# Patient Record
Sex: Male | Born: 1945 | Race: White | Hispanic: No | Marital: Single | Smoking: Former smoker
Health system: Southern US, Community
[De-identification: ages and names within clinical notes are randomized; demographics above are authoritative.]

## PROBLEM LIST (undated history)

## (undated) DIAGNOSIS — R011 Cardiac murmur, unspecified: Secondary | ICD-10-CM

## (undated) DIAGNOSIS — I10 Essential (primary) hypertension: Secondary | ICD-10-CM

## (undated) DIAGNOSIS — K219 Gastro-esophageal reflux disease without esophagitis: Secondary | ICD-10-CM

## (undated) HISTORY — PX: PACEMAKER INSERTION: SHX728

## (undated) HISTORY — PX: TONSILLECTOMY: SUR1361

## (undated) HISTORY — PX: CORONARY ARTERY BYPASS GRAFT: SHX141

## (undated) HISTORY — PX: APPENDECTOMY: SHX54

## (undated) HISTORY — DX: Gastro-esophageal reflux disease without esophagitis: K21.9

## (undated) HISTORY — DX: Cardiac murmur, unspecified: R01.1

## (undated) HISTORY — PX: HERNIA REPAIR: SHX51

---

## 2002-11-01 ENCOUNTER — Ambulatory Visit (HOSPITAL_COMMUNITY): Admission: RE | Admit: 2002-11-01 | Discharge: 2002-11-01 | Payer: Self-pay | Admitting: General Surgery

## 2004-07-24 ENCOUNTER — Ambulatory Visit: Payer: Self-pay | Admitting: Internal Medicine

## 2005-10-16 ENCOUNTER — Ambulatory Visit: Payer: Self-pay | Admitting: *Deleted

## 2015-03-18 ENCOUNTER — Emergency Department (HOSPITAL_COMMUNITY): Payer: Medicare Other

## 2015-03-18 ENCOUNTER — Encounter (HOSPITAL_COMMUNITY): Payer: Self-pay | Admitting: Emergency Medicine

## 2015-03-18 ENCOUNTER — Inpatient Hospital Stay (HOSPITAL_COMMUNITY)
Admission: EM | Admit: 2015-03-18 | Discharge: 2015-03-20 | DRG: 191 | Disposition: A | Discharge status: Home / Self Care | Payer: Medicare Other | Attending: Internal Medicine | Admitting: Internal Medicine

## 2015-03-18 DIAGNOSIS — Z9049 Acquired absence of other specified parts of digestive tract: Secondary | ICD-10-CM | POA: Diagnosis not present

## 2015-03-18 DIAGNOSIS — R000 Tachycardia, unspecified: Secondary | ICD-10-CM | POA: Diagnosis not present

## 2015-03-18 DIAGNOSIS — F17210 Nicotine dependence, cigarettes, uncomplicated: Secondary | ICD-10-CM | POA: Diagnosis not present

## 2015-03-18 DIAGNOSIS — E871 Hypo-osmolality and hyponatremia: Secondary | ICD-10-CM | POA: Diagnosis not present

## 2015-03-18 DIAGNOSIS — J441 Chronic obstructive pulmonary disease with (acute) exacerbation: Secondary | ICD-10-CM | POA: Diagnosis not present

## 2015-03-18 DIAGNOSIS — E669 Obesity, unspecified: Secondary | ICD-10-CM | POA: Diagnosis not present

## 2015-03-18 DIAGNOSIS — I10 Essential (primary) hypertension: Secondary | ICD-10-CM | POA: Diagnosis not present

## 2015-03-18 DIAGNOSIS — Z6832 Body mass index (BMI) 32.0-32.9, adult: Secondary | ICD-10-CM | POA: Diagnosis not present

## 2015-03-18 DIAGNOSIS — R0902 Hypoxemia: Secondary | ICD-10-CM | POA: Diagnosis not present

## 2015-03-18 DIAGNOSIS — D72829 Elevated white blood cell count, unspecified: Secondary | ICD-10-CM | POA: Diagnosis not present

## 2015-03-18 DIAGNOSIS — J80 Acute respiratory distress syndrome: Secondary | ICD-10-CM | POA: Diagnosis present

## 2015-03-18 DIAGNOSIS — R0600 Dyspnea, unspecified: Secondary | ICD-10-CM | POA: Diagnosis not present

## 2015-03-18 DIAGNOSIS — J440 Chronic obstructive pulmonary disease with acute lower respiratory infection: Secondary | ICD-10-CM | POA: Diagnosis not present

## 2015-03-18 DIAGNOSIS — R739 Hyperglycemia, unspecified: Secondary | ICD-10-CM | POA: Diagnosis present

## 2015-03-18 DIAGNOSIS — T380X5A Adverse effect of glucocorticoids and synthetic analogues, initial encounter: Secondary | ICD-10-CM | POA: Diagnosis not present

## 2015-03-18 DIAGNOSIS — Z87898 Personal history of other specified conditions: Secondary | ICD-10-CM | POA: Diagnosis present

## 2015-03-18 HISTORY — DX: Essential (primary) hypertension: I10

## 2015-03-18 LAB — BASIC METABOLIC PANEL
ANION GAP: 10 (ref 5–15)
BUN: 16 mg/dL (ref 6–20)
CALCIUM: 8.9 mg/dL (ref 8.9–10.3)
CHLORIDE: 96 mmol/L — AB (ref 101–111)
CO2: 24 mmol/L (ref 22–32)
CREATININE: 0.85 mg/dL (ref 0.61–1.24)
GFR calc Af Amer: >60 mL/min (ref >60)
GFR calc non Af Amer: >60 mL/min (ref >60)
GLUCOSE: 145 mg/dL — AB (ref 65–99)
POTASSIUM: 4.2 mmol/L (ref 3.5–5.1)
SODIUM: 130 mmol/L — AB (ref 135–145)

## 2015-03-18 LAB — COMPREHENSIVE METABOLIC PANEL
ALK PHOS: 57 U/L (ref 38–126)
ALT: 23 U/L (ref 17–63)
AST: 30 U/L (ref 15–41)
Albumin: 4 g/dL (ref 3.5–5.0)
Anion gap: 9 (ref 5–15)
BUN: 20 mg/dL (ref 6–20)
CO2: 27 mmol/L (ref 22–32)
Calcium: 9.2 mg/dL (ref 8.9–10.3)
Chloride: 94 mmol/L — ABNORMAL LOW (ref 101–111)
Creatinine, Ser: 1.03 mg/dL (ref 0.61–1.24)
Glucose, Bld: 171 mg/dL — ABNORMAL HIGH (ref 65–99)
POTASSIUM: 4.1 mmol/L (ref 3.5–5.1)
SODIUM: 130 mmol/L — AB (ref 135–145)
Total Bilirubin: 0.5 mg/dL (ref 0.3–1.2)
Total Protein: 7.3 g/dL (ref 6.5–8.1)

## 2015-03-18 LAB — CBC WITH DIFFERENTIAL/PLATELET
BASOS ABS: 0 10*3/uL (ref 0.0–0.1)
BASOS PCT: 0 % (ref 0–1)
EOS ABS: 0 10*3/uL (ref 0.0–0.7)
EOS PCT: 0 % (ref 0–5)
HEMATOCRIT: 41.5 % (ref 39.0–52.0)
HEMOGLOBIN: 14.3 g/dL (ref 13.0–17.0)
Lymphocytes Relative: 3 % — ABNORMAL LOW (ref 12–46)
Lymphs Abs: 0.4 10*3/uL — ABNORMAL LOW (ref 0.7–4.0)
MCH: 30 pg (ref 26.0–34.0)
MCHC: 34.5 g/dL (ref 30.0–36.0)
MCV: 87.2 fL (ref 78.0–100.0)
MONO ABS: 0.4 10*3/uL (ref 0.1–1.0)
MONOS PCT: 3 % (ref 3–12)
Neutro Abs: 16 10*3/uL — ABNORMAL HIGH (ref 1.7–7.7)
Neutrophils Relative %: 94 % — ABNORMAL HIGH (ref 43–77)
Platelets: 281 10*3/uL (ref 150–400)
RBC: 4.76 MIL/uL (ref 4.22–5.81)
RDW: 13.5 % (ref 11.5–15.5)
WBC: 16.9 10*3/uL — ABNORMAL HIGH (ref 4.0–10.5)

## 2015-03-18 LAB — EXPECTORATED SPUTUM ASSESSMENT W REFEX TO RESP CULTURE

## 2015-03-18 LAB — TROPONIN I: Troponin I: 0.03 ng/mL (ref <0.031)

## 2015-03-18 LAB — BRAIN NATRIURETIC PEPTIDE: B Natriuretic Peptide: 127.9 pg/mL — ABNORMAL HIGH (ref 0.0–100.0)

## 2015-03-18 LAB — EXPECTORATED SPUTUM ASSESSMENT W GRAM STAIN, RFLX TO RESP C

## 2015-03-18 MED ORDER — METHYLPREDNISOLONE SODIUM SUCC 125 MG IJ SOLR
80.0000 mg | Freq: Once | INTRAMUSCULAR | Status: DC
  Filled 2015-03-18: qty 1.28

## 2015-03-18 MED ORDER — ALBUTEROL (5 MG/ML) CONTINUOUS INHALATION SOLN
10.0000 mg/h | INHALATION_SOLUTION | Freq: Once | RESPIRATORY_TRACT | Status: AC
  Administered 2015-03-18: 10 mg/h via RESPIRATORY_TRACT
  Filled 2015-03-18: qty 20

## 2015-03-18 MED ORDER — IPRATROPIUM-ALBUTEROL 0.5-2.5 (3) MG/3ML IN SOLN: 3.0000 mL | RESPIRATORY_TRACT | Status: DC | PRN

## 2015-03-18 MED ORDER — DOXYCYCLINE HYCLATE 100 MG PO TABS
100.0000 mg | ORAL_TABLET | Freq: Two times a day (BID) | ORAL | Status: DC
  Filled 2015-03-18: qty 1

## 2015-03-18 MED ORDER — ENOXAPARIN SODIUM 40 MG/0.4ML ~~LOC~~ SOLN
40.0000 mg | SUBCUTANEOUS | Status: DC
  Administered 2015-03-18 – 2015-03-19 (×2): 40 mg via SUBCUTANEOUS
  Filled 2015-03-18 (×3): qty 0.4

## 2015-03-18 MED ORDER — IPRATROPIUM-ALBUTEROL 0.5-2.5 (3) MG/3ML IN SOLN: 3.0000 mL | RESPIRATORY_TRACT | Status: DC

## 2015-03-18 MED ORDER — IPRATROPIUM-ALBUTEROL 0.5-2.5 (3) MG/3ML IN SOLN
RESPIRATORY_TRACT | Status: AC
  Administered 2015-03-18: 3 mL via RESPIRATORY_TRACT
  Filled 2015-03-18: qty 3

## 2015-03-18 MED ORDER — CEFTRIAXONE SODIUM IN DEXTROSE 20 MG/ML IV SOLN
1.0000 g | INTRAVENOUS | Status: DC
  Administered 2015-03-18 – 2015-03-19 (×2): 1 g via INTRAVENOUS
  Filled 2015-03-18 (×3): qty 50

## 2015-03-18 MED ORDER — HYDROMORPHONE HCL 2 MG/ML IJ SOLN: 2.0000 mg | Freq: Once | INTRAMUSCULAR | Status: DC

## 2015-03-18 MED ORDER — ALUM & MAG HYDROXIDE-SIMETH 200-200-20 MG/5ML PO SUSP
30.0000 mL | Freq: Once | ORAL | Status: AC
  Administered 2015-03-18: 30 mL via ORAL
  Filled 2015-03-18: qty 30

## 2015-03-18 MED ORDER — PNEUMOCOCCAL VAC POLYVALENT 25 MCG/0.5ML IJ INJ
0.5000 mL | INJECTION | INTRAMUSCULAR | Status: AC
  Administered 2015-03-19: 0.5 mL via INTRAMUSCULAR
  Filled 2015-03-18 (×2): qty 0.5

## 2015-03-18 MED ORDER — DEXTROSE 5 % IV SOLN
500.0000 mg | INTRAVENOUS | Status: DC
  Administered 2015-03-18 – 2015-03-19 (×2): 500 mg via INTRAVENOUS
  Filled 2015-03-18 (×3): qty 500

## 2015-03-18 MED ORDER — OXYMETAZOLINE HCL 0.05 % NA SOLN: 1.0000 | Freq: Two times a day (BID) | NASAL | Status: DC | PRN

## 2015-03-18 MED ORDER — NICOTINE 21 MG/24HR TD PT24
21.0000 mg | MEDICATED_PATCH | Freq: Every day | TRANSDERMAL | Status: DC
  Administered 2015-03-18 – 2015-03-20 (×3): 21 mg via TRANSDERMAL
  Filled 2015-03-18 (×3): qty 1

## 2015-03-18 MED ORDER — IPRATROPIUM-ALBUTEROL 0.5-2.5 (3) MG/3ML IN SOLN
3.0000 mL | Freq: Four times a day (QID) | RESPIRATORY_TRACT | Status: DC
  Administered 2015-03-18 – 2015-03-19 (×5): 3 mL via RESPIRATORY_TRACT
  Filled 2015-03-18 (×4): qty 3

## 2015-03-18 MED ORDER — ZOLPIDEM TARTRATE 5 MG PO TABS
5.0000 mg | ORAL_TABLET | Freq: Every evening | ORAL | Status: DC | PRN
  Administered 2015-03-18: 5 mg via ORAL
  Filled 2015-03-18: qty 1

## 2015-03-18 MED ORDER — METHYLPREDNISOLONE SODIUM SUCC 125 MG IJ SOLR
80.0000 mg | Freq: Four times a day (QID) | INTRAMUSCULAR | Status: DC
  Administered 2015-03-18 – 2015-03-19 (×5): 80 mg via INTRAVENOUS
  Filled 2015-03-18 (×6): qty 1.28

## 2015-03-18 MED ORDER — HYDROCHLOROTHIAZIDE 12.5 MG PO CAPS
12.5000 mg | ORAL_CAPSULE | Freq: Every day | ORAL | Status: DC
  Administered 2015-03-18 – 2015-03-20 (×3): 12.5 mg via ORAL
  Filled 2015-03-18 (×3): qty 1

## 2015-03-18 MED ORDER — GUAIFENESIN ER 600 MG PO TB12
600.0000 mg | ORAL_TABLET | Freq: Two times a day (BID) | ORAL | Status: DC
  Administered 2015-03-18 – 2015-03-20 (×5): 600 mg via ORAL
  Filled 2015-03-18 (×7): qty 1

## 2015-03-18 MED ORDER — METHYLPREDNISOLONE SODIUM SUCC 125 MG IJ SOLR: 125.0000 mg | Freq: Once | INTRAMUSCULAR | Status: DC

## 2015-03-18 MED ORDER — GUAIFENESIN-DM 100-10 MG/5ML PO SYRP: 5.0000 mL | ORAL_SOLUTION | ORAL | Status: DC | PRN

## 2015-03-18 MED ORDER — SODIUM CHLORIDE 0.9 % IJ SOLN
3.0000 mL | Freq: Two times a day (BID) | INTRAMUSCULAR | Status: DC
  Administered 2015-03-18 – 2015-03-19 (×4): 3 mL via INTRAVENOUS

## 2015-03-18 MED ORDER — LISINOPRIL 10 MG PO TABS
10.0000 mg | ORAL_TABLET | Freq: Every day | ORAL | Status: DC
  Administered 2015-03-18 – 2015-03-20 (×3): 10 mg via ORAL
  Filled 2015-03-18 (×3): qty 1

## 2015-03-18 MED ORDER — LISINOPRIL-HYDROCHLOROTHIAZIDE 10-12.5 MG PO TABS: 1.0000 | ORAL_TABLET | Freq: Every day | ORAL | Status: DC

## 2015-03-18 NOTE — H&P (Signed)
[facility] History and Physical  Mady Haagensenaul R Edler ZOX:[id number] DOB: 01/18/1946 DOA: 03/18/2015  Referring physician: ED physician, Dr. Rhunette CroftNanavati  PCP: Pt has no PCP   Chief Complaint: dyspnea  HPI:  Pt is 69 yo male with known HTN, history of > 30 yrs of 1 ppd smoking, [facility] with main concern of several days duration of progressively worsening dyspnea that initially started with exertion and has progressed to dyspnea at rest in the past 24 hours, associated with mixed episodes of productive and non productive cough, wheezing, malaise. Pt has denies chest pain or abd concerns, no similar events in the past, continues to smoke.   In ED, pt noted to be wheezing and with oxygen saturation 91% on RA requiring placement on oxygen. Also started on BD and given solumedrol. TRH asked to admit for further evaluation.   Assessment and Plan: Active Problems:   Acute respiratory distress secondary to COPD with acute exacerbation and with hypoxia - admit to telemetry unit due to tachycardia - place on solumedrol, BD's scheduled and as needed - place on empiric ABX, follow up on sputum culture, strep pneumo and urine legionella - also provide antitussives as needed along with mucinex    Tobacco abuse - cessation discussed in detail and pt has verbalized understanding  - provide nicotine patch    Alcohol use - check CMET - may need to be placed on CIWA if signs of withdrawal noted, non currently    Acute hyponatremia - will allow regular diet and see if it stabilizes    Hyperglycemia - possibly steroid induced - check A1C   Leukocytosis - possibly from acute illness - started on empiric ABX and will repeat CBC in AM   HTN - continue home medical regimen   DVT prophylaxis - continue Lovenox    Obesity - Body mass index is 32.57 kg/(m^2).  Radiological Exams on Admission: Dg Chest 2 View 03/18/2015  Findings of COPD without superimposed acute cardiopulmonary process.       Code Status: Full Family Communication: Pt and wife at bedside Disposition Plan: Admit for further evaluation    Danie Binderskra Tae Vonada [facility] [phone]   Review of Systems:  Constitutional: Negative for fever, chills. Negative for diaphoresis.  HENT: Negative for hearing loss, ear pain, nosebleeds, congestion, sore throat, neck pain, tinnitus and ear discharge.   Eyes: Negative for blurred vision, double vision, photophobia, pain, discharge and redness.  Respiratory: Negative for hemoptysis and stridor.   Cardiovascular: Negative for chest pain, palpitations, orthopnea, claudication and leg swelling.  Gastrointestinal: Negative for nausea, vomiting and abdominal pain. Negative for heartburn, constipation, blood in stool and melena.  Genitourinary: Negative for dysuria, urgency, frequency, hematuria and flank pain.  Musculoskeletal: Negative for myalgias, back pain, joint pain and falls.  Skin: Negative for itching and rash.  Neurological: Negative for dizziness and weakness.  Endo/Heme/Allergies: Negative for environmental allergies and polydipsia. Does not bruise/bleed easily.  Psychiatric/Behavioral: Negative for suicidal ideas. The patient is not nervous/anxious.      Past Medical History  Diagnosis Date  . Hypertension     Past Surgical History  Procedure Laterality Date  . Appendectomy    . Hernia repair      Social History:  reports that he has been smoking.  He has never used smokeless tobacco. He reports that he drinks alcohol. His drug history is not on file.  No Known Allergies  Pt reports family history of HTN   Medication Sig  guaiFENesin (MUCINEX)  600 MG 12 hr tablet Take 1,200 mg by mouth once.   lisinopril-hydrochlorothiazide  Take 1 tablet by mouth daily.  zolpidem (AMBIEN) 10 MG tablet Take 10 mg by mouth at bedtime as needed for sleep.    Physical Exam: Filed Vitals:   03/18/15 0749 03/18/15 0830 03/18/15 0908 03/18/15 1000  BP:  128/64 129/62   Pulse: 117  112 111   Temp:   98 F (36.7 C)   TempSrc:   Oral   Resp:  18 18   Height:    (1.88 m)   Weight:   115.123 kg (253 lb 12.8 oz)   SpO2: 91% 93% 96% 96%    Physical Exam  Constitutional: Appears well-developed and well-nourished. No distress.  HENT: Normocephalic. External right and left ear normal. Oropharynx is clear and moist.  Eyes: Conjunctivae and EOM are normal. PERRLA, no scleral icterus.  Neck: Normal ROM. Neck supple. No JVD. No tracheal deviation. No thyromegaly.  CVS: Regular rhythm, tachycardic, S1/S2 +, no murmurs, no gallops, no carotid bruit.  Pulmonary: diffused wheezing with diminished breath sounds bilaterally  Abdominal: Soft. BS +,  no distension, tenderness, rebound or guarding.  Musculoskeletal: Normal range of motion. No edema and no tenderness.  Lymphadenopathy: No lymphadenopathy noted, cervical, inguinal. Neuro: Alert. Normal reflexes, muscle tone coordination. No cranial nerve deficit. Skin: Skin is warm and dry. No rash noted. Not diaphoretic. No erythema. No pallor.  Psychiatric: Normal mood and affect. Behavior, judgment, thought content normal.   Labs on Admission:  Basic Metabolic Panel:  Recent Labs Lab 03/18/15 0620  NA 130*  K 4.2  CL 96*  CO2 24  GLUCOSE 145*  BUN 16  CREATININE 0.85  CALCIUM 8.9   CBC:  Recent Labs Lab 03/18/15 0620  WBC 16.9*  NEUTROABS 16.0*  HGB 14.3  HCT 41.5  MCV 87.2  PLT 281   Cardiac Enzymes:  Recent Labs Lab 03/18/15 0620  TROPONINI 0.03    EKG: pending    If 7PM-7AM, please contact night-coverage [email or web address] Password [email or web address] 03/18/2015, 11:01 AM

## 2015-03-18 NOTE — ED Notes (Signed)
Awake. Verbally responsive. A/O x4. Resp even and unlabored. Noted moist unproductive cough. ABC's intact. ST on monitor. IV saline lock patent and intact. Family at bedside.

## 2015-03-18 NOTE — ED Provider Notes (Signed)
CSN: [id number]     Arrival date & time 03/18/15  0455 History   First MD Initiated Contact with Patient 03/18/15 [phone]     Chief Complaint  Patient presents with  . Shortness of Breath     (Consider location/radiation/quality/duration/timing/severity/associated sxs/prior Treatment) HPI Comments: Pt comes in with cc of dib. Pt has hx of HTN. He is a pack/day smoker, but has no COPD hx. He started having dib yday while hwas golfing, he could barely get around and stopped after 6 rounds. Pt reports that his DIB persisted through out the day and he couldn't sleep, so decided to come to the ER. Pt received 10 mg albuterol and 0.5 atrovent. Reports he is feeling a lot better, but still wheezing. + cough, clear sputum. There is no chest pains.    ROS 10 Systems reviewed and are negative for acute change except as noted in the HPI.     Patient is a 69 y.o. male presenting with shortness of breath. The history is provided by the patient.  Shortness of Breath Associated symptoms: cough and wheezing     Past Medical History  Diagnosis Date  . Hypertension    Past Surgical History  Procedure Laterality Date  . Appendectomy    . Hernia repair     History reviewed. No pertinent family history. History  Substance Use Topics  . Smoking status: Current Every Day Smoker  . Smokeless tobacco: Never Used  . Alcohol Use: Yes    Review of Systems  Respiratory: Positive for cough, shortness of breath and wheezing.   All other systems reviewed and are negative.     Allergies  Review of patient's allergies indicates no known allergies.  Home Medications   Prior to Admission medications   Medication Sig Start Date End Date Taking? Authorizing Provider  Dextromethorphan-Guaifenesin (ROBITUSSIN COUGH+CHEST CONG DM) 5-100 MG/5ML LIQD Take 10 mLs by mouth every 6 (six) hours as needed (for cold symptoms).   Yes Historical Provider, MD  DM-Phenylephrine-Acetaminophen (VICKS DAYQUIL COLD &  FLU) 10-5-325 MG/15ML LIQD Take 30 mLs by mouth every 6 (six) hours as needed (for cold symptoms).    Yes Historical Provider, MD  guaiFENesin (MUCINEX) 600 MG 12 hr tablet Take 1,200 mg by mouth once.    Yes Historical Provider, MD  lisinopril-hydrochlorothiazide (PRINZIDE,ZESTORETIC) 10-12.5 MG per tablet Take 1 tablet by mouth daily. 02/28/15  Yes Historical Provider, MD  oxymetazoline (AFRIN NASAL SPRAY) 0.05 % nasal spray Place 1 spray into both nostrils 2 (two) times daily as needed for congestion.   Yes Historical Provider, MD  zolpidem (AMBIEN) 10 MG tablet Take 10 mg by mouth at bedtime as needed for sleep.   Yes Historical Provider, MD   BP 143/76 mmHg  Pulse 110  Temp(Src) 97.7 F (36.5 C) (Oral)  Resp 20  Ht 6\' 2"  (1.88 m)  Wt 253 lb 12.8 oz (115.123 kg)  BMI 32.57 kg/m2  SpO2 97% Physical Exam  Constitutional: He is oriented to person, place, and time. He appears well-developed.  HENT:  Head: Normocephalic and atraumatic.  Eyes: Conjunctivae and EOM are normal. Pupils are equal, round, and reactive to light.  Neck: Normal range of motion. Neck supple.  Cardiovascular: Normal rate, regular rhythm and normal heart sounds.   Pulmonary/Chest: He is in respiratory distress. He has wheezes.  RR - 24, O2 sats 92%   Abdominal: Soft. Bowel sounds are normal. He exhibits no distension. There is no tenderness. There is no rebound and no guarding.  Neurological: He is alert and oriented to person, place, and time.  Skin: Skin is warm.  Nursing note and vitals reviewed.   ED Course  Procedures (including critical care time) Labs Review Labs Reviewed  CBC WITH DIFFERENTIAL/PLATELET - Abnormal; Notable for the following:    WBC 16.9 (*)    Neutrophils Relative % 94 (*)    Neutro Abs 16.0 (*)    Lymphocytes Relative 3 (*)    Lymphs Abs 0.4 (*)    All other components within normal limits  BASIC METABOLIC PANEL - Abnormal; Notable for the following:    Sodium 130 (*)     Chloride 96 (*)    Glucose, Bld 145 (*)    All other components within normal limits  BRAIN NATRIURETIC PEPTIDE - Abnormal; Notable for the following:    B Natriuretic Peptide 127.9 (*)    All other components within normal limits  COMPREHENSIVE METABOLIC PANEL - Abnormal; Notable for the following:    Sodium 130 (*)    Chloride 94 (*)    Glucose, Bld 171 (*)    All other components within normal limits  BASIC METABOLIC PANEL - Abnormal; Notable for the following:    Sodium 131 (*)    Chloride 95 (*)    Glucose, Bld 166 (*)    BUN 21 (*)    All other components within normal limits  CBC - Abnormal; Notable for the following:    WBC 17.4 (*)    All other components within normal limits  CULTURE, EXPECTORATED SPUTUM-ASSESSMENT  CULTURE, RESPIRATORY (NON-EXPECTORATED)  TROPONIN I  STREP PNEUMONIAE URINARY ANTIGEN  LEGIONELLA ANTIGEN, URINE  HEMOGLOBIN A1C    Imaging Review Dg Chest 2 View  03/18/2015   CLINICAL DATA:  Shortness of breath, no chest pain.  Current smoker.  EXAM: CHEST  2 VIEW  COMPARISON:  None.  FINDINGS: Cardiomediastinal silhouette is unremarkable. Increased lung volumes with mildly flattened hemidiaphragm, no pleural effusion or focal consolidation. Very mild central peribronchial wall thickening. No pneumothorax. Soft tissue planes and included osseous structures are normal.  IMPRESSION: Findings of COPD without superimposed acute cardiopulmonary process.   Electronically Signed   By: Awilda Metro M.D.   On: 03/18/2015 05:52     EKG Interpretation   Date/Time:  Sunday March 18 2015 05:00:39 EDT Ventricular Rate:  112 PR Interval:  202 QRS Duration: 149 QT Interval:  434 QTC Calculation: 592 R Axis:   -76 Text Interpretation:  Sinus  tachycardia Borderline prolonged PR interval  Right bundle branch block Anterolateral infarct, age indeterminate ED  PHYSICIAN INTERPRETATION AVAILABLE IN [facility] Reconfirmed by  Rhunette Croft, MD, Janey Genta [phone]) on  03/18/2015 8:03:55 AM      :30 - pt is still wheezing. With new onset COPD, i dont think he will be a good candidate for discharge - he will be admitted so that he can get optimized and educated. Pt and wife informed. Doxy given.   MDM   Final diagnoses:  COPD with exacerbation     PT comes in with cc of dib. Pt has has a cough, wheezing and shortness of breath. No hx of COPD, asthma, no chest pain. He is wheezing and is s/p 10 mg and 15 mg albuterol prior to my evaluation. We will start him on more albuterol and reassess.   CRITICAL CARE Performed by: Derwood Kaplan   Total critical care time: 40 minutes - constant wheezing and resp distress with multiple assessments  Critical care time was exclusive  of separately billable procedures and treating other patients.  Critical care was necessary to treat or prevent imminent or life-threatening deterioration.  Critical care was time spent personally by me on the following activities: development of treatment plan with patient and/or surrogate as well as nursing, discussions with consultants, evaluation of patient's response to treatment, examination of patient, obtaining history from patient or surrogate, ordering and performing treatments and interventions, ordering and review of laboratory studies, ordering and review of radiographic studies, pulse oximetry and re-evaluation of patient's condition.    Derwood Kaplan, MD 03/19/15 [phone]

## 2015-03-18 NOTE — ED Notes (Signed)
Pt denies dizziness/lightheadedness or SHOB during ambulation.

## 2015-03-18 NOTE — ED Notes (Addendum)
Spoke with Emily,4th floor CN. Time upstairs @ [phone]

## 2015-03-18 NOTE — ED Notes (Signed)
Brought in by EMS from home with c/o shortness of breath.  Pt reported that he started having shortness of breath at around 0900 yesterday, got worse and called EMS at around 0100.  Pt was given Albuterol 10 mg and Atrovent 0.5 mg neb treatment at the scene and had relief---- decided not to come for further evaluation.  Shortness of breath recurred few minutes ago and called EMS again.  Pt was given Albuterol 5 mg and Atrovent 0.5 mg neb treatment and Solu-Medrol 125 mg IV en route to ED.  Pt arrived to ED on ongoing neb tx; speaks in clear and complete sentences.

## 2015-03-19 DIAGNOSIS — J441 Chronic obstructive pulmonary disease with (acute) exacerbation: Secondary | ICD-10-CM | POA: Diagnosis not present

## 2015-03-19 DIAGNOSIS — J440 Chronic obstructive pulmonary disease with acute lower respiratory infection: Secondary | ICD-10-CM

## 2015-03-19 LAB — BASIC METABOLIC PANEL
Anion gap: 8 (ref 5–15)
BUN: 21 mg/dL — ABNORMAL HIGH (ref 6–20)
CO2: 28 mmol/L (ref 22–32)
Calcium: 9.3 mg/dL (ref 8.9–10.3)
Chloride: 95 mmol/L — ABNORMAL LOW (ref 101–111)
Creatinine, Ser: 0.84 mg/dL (ref 0.61–1.24)
GFR calc Af Amer: >60 mL/min (ref >60)
GFR calc non Af Amer: >60 mL/min (ref >60)
Glucose, Bld: 166 mg/dL — ABNORMAL HIGH (ref 65–99)
POTASSIUM: 4.1 mmol/L (ref 3.5–5.1)
Sodium: 131 mmol/L — ABNORMAL LOW (ref 135–145)

## 2015-03-19 LAB — CBC
HCT: 40.7 % (ref 39.0–52.0)
Hemoglobin: 14 g/dL (ref 13.0–17.0)
MCH: 30 pg (ref 26.0–34.0)
MCHC: 34.4 g/dL (ref 30.0–36.0)
MCV: 87.2 fL (ref 78.0–100.0)
Platelets: 304 10*3/uL (ref 150–400)
RBC: 4.67 MIL/uL (ref 4.22–5.81)
RDW: 14.1 % (ref 11.5–15.5)
WBC: 17.4 10*3/uL — ABNORMAL HIGH (ref 4.0–10.5)

## 2015-03-19 LAB — HEMOGLOBIN A1C
HEMOGLOBIN A1C: 5.8 % — AB (ref 4.8–5.6)
Mean Plasma Glucose: 120 mg/dL

## 2015-03-19 LAB — LEGIONELLA ANTIGEN, URINE

## 2015-03-19 LAB — STREP PNEUMONIAE URINARY ANTIGEN: Strep Pneumo Urinary Antigen: NEGATIVE

## 2015-03-19 MED ORDER — METHYLPREDNISOLONE SODIUM SUCC 40 MG IJ SOLR
40.0000 mg | Freq: Two times a day (BID) | INTRAMUSCULAR | Status: DC
  Administered 2015-03-19: 40 mg via INTRAVENOUS
  Filled 2015-03-19 (×2): qty 1

## 2015-03-19 MED ORDER — SENNA 8.6 MG PO TABS
1.0000 | ORAL_TABLET | Freq: Every day | ORAL | Status: DC
  Administered 2015-03-19: 8.6 mg via ORAL
  Filled 2015-03-19: qty 1

## 2015-03-19 MED ORDER — GI COCKTAIL ~~LOC~~
30.0000 mL | Freq: Four times a day (QID) | ORAL | Status: DC | PRN
  Administered 2015-03-19 (×2): 30 mL via ORAL
  Filled 2015-03-19 (×4): qty 30

## 2015-03-19 MED ORDER — LEVALBUTEROL HCL 1.25 MG/0.5ML IN NEBU
1.2500 mg | INHALATION_SOLUTION | RESPIRATORY_TRACT | Status: DC | PRN
  Filled 2015-03-19: qty 0.5

## 2015-03-19 MED ORDER — LEVALBUTEROL HCL 1.25 MG/0.5ML IN NEBU
1.2500 mg | INHALATION_SOLUTION | Freq: Four times a day (QID) | RESPIRATORY_TRACT | Status: DC
  Administered 2015-03-20: 1.25 mg via RESPIRATORY_TRACT
  Filled 2015-03-19 (×3): qty 0.5

## 2015-03-19 MED ORDER — LEVALBUTEROL HCL 1.25 MG/0.5ML IN NEBU
1.2500 mg | INHALATION_SOLUTION | Freq: Four times a day (QID) | RESPIRATORY_TRACT | Status: DC
  Administered 2015-03-19 (×2): 1.25 mg via RESPIRATORY_TRACT
  Filled 2015-03-19 (×3): qty 0.5

## 2015-03-19 MED ORDER — ZOLPIDEM TARTRATE 10 MG PO TABS
10.0000 mg | ORAL_TABLET | Freq: Every evening | ORAL | Status: DC | PRN
  Administered 2015-03-19: 10 mg via ORAL
  Filled 2015-03-19: qty 1

## 2015-03-19 NOTE — Progress Notes (Signed)
SATURATION QUALIFICATIONS: (This note is used to comply with regulatory documentation for home oxygen)  Patient Saturations on Room Air at Rest = 96%  Patient Saturations on Room Air while Ambulating = 95%   Please briefly explain why patient needs home oxygen: No need for home Oxygen at this time

## 2015-03-19 NOTE — Progress Notes (Signed)
Patient ID: Joseph Nicholson, male   DOB: 06/19/1946, 69 y.o.   MRN: [id number]  [facility] PROGRESS NOTE  Joseph Nicholson [id number] DOB: 10/06/1945 DOA: 03/18/2015 PCP: No primary care provider on file.   Brief narrative:    Pt is 69 yo male with known HTN, history of > 30 yrs of 1 ppd smoking, [facility] with main concern of several days duration of progressively worsening dyspnea that initially started with exertion and has progressed to dyspnea at rest in the past 24 hours, associated with mixed episodes of productive and non productive cough, wheezing, malaise. Pt has denies chest pain or abd concerns, no similar events in the past, continues to smoke.   In ED, pt noted to be wheezing and with oxygen saturation 91% on RA requiring placement on oxygen. Also started on BD and given solumedrol. TRH asked to admit for further evaluation.   Assessment/Plan:    Active Problems:  Acute respiratory distress secondary to COPD with acute exacerbation and with hypoxia - pt clinically improving with minimal wheezing on exam this AM - continue to provide BD's scheduled and as needed, change to levalbuterol due to tachycardia  - continue empiric ABX day #2, taper down solumedrol - also provide antitussives as needed along with mucinex   Tobacco abuse - cessation discussed in detail and pt has verbalized understanding  - provide nicotine patch   Alcohol use - no signs of withdrawal noted  Acute hyponatremia - improving   Hyperglycemia - possibly steroid induced - A1C 5.8  Leukocytosis - possibly from acute illness and steroid induced  - repeat CBC in AM  HTN - continue home medical regimen  DVT prophylaxis - continue Lovenox   Obesity - Body mass index is 32.57 kg/(m^2).  DVT prophylaxis  Code Status: Full.  Family Communication:  plan of care discussed with the patient Disposition Plan: Home when stable.   IV access:  Peripheral IV  Procedures and  diagnostic studies:    Dg Chest 2 View  03/18/2015   CLINICAL DATA:  Shortness of breath, no chest pain.  Current smoker.  EXAM: CHEST  2 VIEW  COMPARISON:  None.  FINDINGS: Cardiomediastinal silhouette is unremarkable. Increased lung volumes with mildly flattened hemidiaphragm, no pleural effusion or focal consolidation. Very mild central peribronchial wall thickening. No pneumothorax. Soft tissue planes and included osseous structures are normal.  IMPRESSION: Findings of COPD without superimposed acute cardiopulmonary process.   Electronically Signed   By: Awilda Metroourtnay  Bloomer M.D.   On: 03/18/2015 05:52    Medical Consultants:  None  Other Consultants:  Respiratory team   IAnti-Infectives:   Zithromax 7/10 --> Rocephin 7/10 -->  Debbora PrestoMAGICK-Amandine Covino, MD  [facility] Pager [phone]  If 7PM-7AM, please contact night-coverage [email or web address] Password [email or web address] 03/19/2015, 12:06 PM   LOS: 1 day   HPI/Subjective: No events overnight.   Objective: Filed Vitals:   03/18/15 2123 03/19/15 0551 03/19/15 0737 03/19/15 1026  BP: 127/65 143/76  132/74  Pulse: 108 110  105  Temp: 98.2 F (36.8 C) 97.7 F (36.5 C)    TempSrc: Oral Oral    Resp: 20 20    Height:      Weight:      SpO2: 98% 96% 97% 95%    Intake/Output Summary (Last 24 hours) at 03/19/15 1206 Last data filed at 03/19/15 0854  Gross per 24 hour  Intake    723 ml  Output      0 ml  Net  723 ml    Exam:   General:  Pt is alert, follows commands appropriately, not in acute distress  Cardiovascular: Regular rhythm, tachycardic, no murmurs, no rubs, no gallops  Respiratory: Clear to auscultation bilaterally, minimal exp wheezing   Abdomen: Soft, non tender, non distended, bowel sounds present, no guarding  Extremities: No edema, pulses DP and PT palpable bilaterally  Neuro: Grossly nonfocal  Data Reviewed: Basic Metabolic Panel:  Recent Labs Lab 03/18/15 0620 03/18/15 1650 03/19/15 0410  NA 130* 130* 131*  K 4.2  4.1 4.1  CL 96* 94* 95*  CO2 GLUCOSE 145* 171* 166*  BUN 16 20 21*  CREATININE 0.85 1.03 0.84  CALCIUM 8.9 9.2 9.3   Liver Function Tests:  Recent Labs Lab 03/18/15 1650  AST 30  ALT 23  ALKPHOS 57  BILITOT 0.5  PROT 7.3  ALBUMIN 4.0   CBC:  Recent Labs Lab 03/18/15 0620 03/19/15 0410  WBC 16.9* 17.4*  NEUTROABS 16.0*  --   HGB 14.3 14.0  HCT 41.5 40.7  MCV 87.2 87.2  PLT 281 304   Cardiac Enzymes:  Recent Labs Lab 03/18/15 0620  TROPONINI 0.03    Recent Results (from the past 240 hour(s))  Culture, expectorated sputum-assessment     Status: None   Collection Time: 03/18/15  6:00 PM  Result Value Ref Range Status   Specimen Description SPUTUM  Final   Special Requests NONE  Final   Sputum evaluation   Final    THIS SPECIMEN IS ACCEPTABLE. RESPIRATORY CULTURE REPORT TO FOLLOW.   Report Status 03/18/2015 FINAL  Final  Culture, respiratory (NON-Expectorated)     Status: None (Preliminary result)   Collection Time: 03/18/15  6:00 PM  Result Value Ref Range Status   Specimen Description SPUTUM  Final   Special Requests NONE  Final   Gram Stain   Final    MODERATE WBC PRESENT, PREDOMINANTLY PMN FEW SQUAMOUS EPITHELIAL CELLS PRESENT ABUNDANT GRAM NEGATIVE RODS ABUNDANT GRAM POSITIVE RODS ABUNDANT GRAM POSITIVE COCCI IN PAIRS    Culture   Final    Culture reincubated for better growth Performed at [facility]    Report Status PENDING  Incomplete     Scheduled Meds: . azithromycin  500 mg Intravenous Q24H  . cefTRIAXone (ROCEPHIN)  IV  1 g Intravenous Q24H  . enoxaparin (LOVENOX) injection  40 mg Subcutaneous Q24H  . guaiFENesin  600 mg Oral BID  . lisinopril  10 mg Oral Daily   And  . hydrochlorothiazide  12.5 mg Oral Daily  . ipratropium-albuterol  3 mL Nebulization QID  . methylPREDNISolone (SOLU-MEDROL) injection  80 mg Intravenous Q6H  . nicotine  21 mg Transdermal Daily  . sodium chloride  3 mL Intravenous Q12H    Continuous Infusions:

## 2015-03-20 LAB — CBC
HEMATOCRIT: 42.2 % (ref 39.0–52.0)
Hemoglobin: 14.2 g/dL (ref 13.0–17.0)
MCH: 29.3 pg (ref 26.0–34.0)
MCHC: 33.6 g/dL (ref 30.0–36.0)
MCV: 87 fL (ref 78.0–100.0)
Platelets: 331 10*3/uL (ref 150–400)
RBC: 4.85 MIL/uL (ref 4.22–5.81)
RDW: 14.3 % (ref 11.5–15.5)
WBC: 20.4 10*3/uL — AB (ref 4.0–10.5)

## 2015-03-20 LAB — BASIC METABOLIC PANEL
Anion gap: 8 (ref 5–15)
BUN: 33 mg/dL — ABNORMAL HIGH (ref 6–20)
CHLORIDE: 96 mmol/L — AB (ref 101–111)
CO2: 30 mmol/L (ref 22–32)
Calcium: 9.5 mg/dL (ref 8.9–10.3)
Creatinine, Ser: 0.87 mg/dL (ref 0.61–1.24)
GFR calc Af Amer: >60 mL/min (ref >60)
Glucose, Bld: 140 mg/dL — ABNORMAL HIGH (ref 65–99)
POTASSIUM: 4.2 mmol/L (ref 3.5–5.1)
Sodium: 134 mmol/L — ABNORMAL LOW (ref 135–145)

## 2015-03-20 MED ORDER — LEVOFLOXACIN 500 MG PO TABS: 500.0000 mg | ORAL_TABLET | Freq: Every day | ORAL | Status: DC

## 2015-03-20 MED ORDER — LEVALBUTEROL HCL 1.25 MG/0.5ML IN NEBU: 1.2500 mg | INHALATION_SOLUTION | RESPIRATORY_TRACT | Status: AC | PRN

## 2015-03-20 MED ORDER — ZOLPIDEM TARTRATE 10 MG PO TABS: 10.0000 mg | ORAL_TABLET | Freq: Every evening | ORAL | Status: AC | PRN

## 2015-03-20 MED ORDER — PREDNISONE 10 MG PO TABS: ORAL_TABLET | ORAL | Status: DC

## 2015-03-20 MED ORDER — GUAIFENESIN-DM 100-10 MG/5ML PO SYRP: 5.0000 mL | ORAL_SOLUTION | ORAL | Status: AC | PRN

## 2015-03-20 MED ORDER — LISINOPRIL-HYDROCHLOROTHIAZIDE 10-12.5 MG PO TABS: 1.0000 | ORAL_TABLET | Freq: Every day | ORAL | Status: AC

## 2015-03-20 MED ORDER — ALBUTEROL SULFATE HFA 108 (90 BASE) MCG/ACT IN AERS: 1.0000 | INHALATION_SPRAY | RESPIRATORY_TRACT | Status: AC | PRN

## 2015-03-20 NOTE — Discharge Instructions (Signed)

## 2015-03-20 NOTE — Care Management Important Message (Signed)
Important Message  Patient Details  Name: Mady Haagensenaul R Hulen MRN: [id number] Date of Birth: 09/22/1945   Medicare Important Message Given:  Yes-second notification given    Haskell FlirtJamison, Nadine Ryle 03/20/2015, 11:58 AMImportant Message  Patient Details  Name: Mady Haagensenaul R Agredano MRN: [id number] Date of Birth: 03/19/1946   Medicare Important Message Given:  Yes-second notification given    Haskell FlirtJamison, Sherrin Stahle 03/20/2015, 11:58 AM

## 2015-03-20 NOTE — Care Management Note (Signed)
Case Management Note  Patient Details  Name: Joseph Nicholson MRN: [id number] Date of Birth: 04/19/1946  Subjective/Objective:                    Action/Plan:d/c home w/[facility] dme-neb machine. No further d/c needs.   Expected Discharge Date:                 Expected Discharge Plan:  Home/Self Care  In-House Referral:     Discharge planning Services  CM Consult  Post Acute Care Choice:    Choice offered to:     DME Arranged:  Nebulizer machine DME Agency:  [facility]  [facility]     Status of Service:  Completed, signed off  Medicare Important Message Given:  Yes-second notification given Date Medicare IM Given:    Medicare IM give by:    Date Additional Medicare IM Given:    Additional Medicare Important Message give by:     If discussed at Long Length of Stay Meetings, dates discussed:    Additional Comments:  Lanier ClamMahabir, Charmika Macdonnell, RN 03/20/2015, 12:14 PM

## 2015-03-20 NOTE — Discharge Summary (Signed)
Physician Discharge Summary  Joseph Nicholson:[id number] DOB: 1946/01/25 DOA: 03/18/2015  PCP: Pt has no PCP, in the process of establishing PCP  Admit date: 03/18/2015 Discharge date: 03/20/2015  Recommendations for Outpatient Follow-up:  1. Pt will need to follow up with PCP in 2-3 weeks post discharge 2. Please obtain BMP to evaluate electrolytes and kidney function 3. Please note that pt has been advised to continue Prednisone taper upon discharge 4. Pt also advised to complete therapy with Levaquin  Discharge Diagnoses:  Active Problems:   COPD with acute exacerbation   COPD (chronic obstructive pulmonary disease)  Discharge Condition: Stable  Diet recommendation: Heart healthy diet discussed in details    Brief narrative:    Pt is 69 yo male with known HTN, history of > 30 yrs of 1 ppd smoking, presented to [facility] ED with main concern of several days duration of progressively worsening dyspnea that initially started with exertion and has progressed to dyspnea at rest in the past 24 hours, associated with mixed episodes of productive and non productive cough, wheezing, malaise. Pt has denies chest pain or abd concerns, no similar events in the past, continues to smoke.   In ED, pt noted to be wheezing and with oxygen saturation 91% on RA requiring placement on oxygen. Also started on BD and given solumedrol. TRH asked to admit for further evaluation.   Assessment/Plan:    Active Problems:  Acute respiratory distress secondary to COPD with acute exacerbation and with hypoxia - pt clinically improving with minimal wheezing on exam this AM - continue to provide BD's as needed, change to levalbuterol due to tachycardia  - continue empiric ABX Levaquin upon discharge, prednisone tapering  - also provide antitussives as needed along with mucinex   Tobacco abuse - cessation discussed in detail and pt has verbalized understanding   Alcohol use - no signs of withdrawal  noted  Acute hyponatremia - improving   Hyperglycemia - possibly steroid induced - A1C 5.8  Leukocytosis - possibly from acute illness and steroid induced  - repeat CBC in AM  HTN - continue home medical regimen  Obesity - Body mass index is 32.57 kg/(m^2).   Code Status: Full.  Family Communication: plan of care discussed with the patient Disposition Plan: Home   IV access:  Peripheral IV  Procedures and diagnostic studies:    Imaging Results    Dg Chest 2 View 7/10/2016Findings of COPD without superimposed acute cardiopulmonary process.    Medical Consultants:  None  Other Consultants:  Respiratory team   IAnti-Infectives:   Zithromax 7/10 --> 7/12 Rocephin 7/10 --> 7/12 Levaquin upon discharge        Discharge Exam: Filed Vitals:   03/20/15 0613  BP: 137/84  Pulse: 96  Temp: 97.6 F (36.4 C)  Resp: 20   Filed Vitals:   03/19/15 1331 03/19/15 2127 03/20/15 0613 03/20/15 0826  BP: 124/74 157/76 137/84   Pulse: 93 100 96   Temp: 97.5 F (36.4 C) 97.5 F (36.4 C) 97.6 F (36.4 C)   TempSrc: Axillary Oral Oral   Resp: Height:      Weight:      SpO2: 95% 98% 96% 98%    General: Pt is alert, follows commands appropriately, not in acute distress Cardiovascular: Regular rate and rhythm, S1/S2 +, no murmurs, no rubs, no gallops Respiratory: Clear to auscultation bilaterally, no wheezing, mild expiratory wheezing and diminished breath sounds at bases  Abdominal: Soft, non  tender, non distended, bowel sounds +, no guarding Extremities: no edema, no cyanosis, pulses palpable bilaterally DP and PT Neuro: Grossly nonfocal  Discharge Instructions     Medication List    STOP taking these medications        ROBITUSSIN COUGH+CHEST CONG DM 5-100 MG/5ML Liqd  Generic drug:  Dextromethorphan-Guaifenesin  Replaced by:  guaiFENesin-dextromethorphan 100-10 MG/5ML syrup     VICKS DAYQUIL COLD & FLU 10-5-325 MG/15ML  Liqd  Generic drug:  DM-Phenylephrine-Acetaminophen      TAKE these medications        AFRIN NASAL SPRAY 0.05 % nasal spray  Generic drug:  oxymetazoline  Place 1 spray into both nostrils 2 (two) times daily as needed for congestion.     albuterol 108 (90 BASE) MCG/ACT inhaler  Commonly known as:  VENTOLIN HFA  Inhale 1-2 puffs into the lungs every 4 (four) hours as needed for wheezing or shortness of breath.     guaiFENesin 600 MG 12 hr tablet  Commonly known as:  MUCINEX  Take 1,200 mg by mouth once.     guaiFENesin-dextromethorphan 100-10 MG/5ML syrup  Commonly known as:  ROBITUSSIN DM  Take 5 mLs by mouth every 4 (four) hours as needed for cough.     levalbuterol 1.25 MG/0.5ML nebulizer solution  Commonly known as:  XOPENEX  Take 1.25 mg by nebulization every 3 (three) hours as needed for wheezing or shortness of breath.     levofloxacin 500 MG tablet  Commonly known as:  LEVAQUIN  Take 1 tablet (500 mg total) by mouth daily.     lisinopril-hydrochlorothiazide 10-12.5 MG per tablet  Commonly known as:  PRINZIDE,ZESTORETIC  Take 1 tablet by mouth daily.     predniSONE 10 MG tablet  Commonly known as:  DELTASONE  Take 60 mg tablet today and taper down by 10 ,g until completed     zolpidem 10 MG tablet  Commonly known as:  AMBIEN  Take 1 tablet (10 mg total) by mouth at bedtime as needed for sleep.            Follow-up Information    Call Debbora Presto, MD.   Specialty:  Internal Medicine   Why:  As needed call my cell phone [phone]   Contact information:   [address] [place] [address] [phone]        The results of significant diagnostics from this hospitalization (including imaging, microbiology, ancillary and laboratory) are listed below for reference.     Microbiology: Recent Results (from the past 240 hour(s))  Culture, expectorated sputum-assessment     Status: None   Collection Time: 03/18/15  6:00 PM   Result Value Ref Range Status   Specimen Description SPUTUM  Final   Special Requests NONE  Final   Sputum evaluation   Final    THIS SPECIMEN IS ACCEPTABLE. RESPIRATORY CULTURE REPORT TO FOLLOW.   Report Status 03/18/2015 FINAL  Final  Culture, respiratory (NON-Expectorated)     Status: None (Preliminary result)   Collection Time: 03/18/15  6:00 PM  Result Value Ref Range Status   Specimen Description SPUTUM  Final   Special Requests NONE  Final   Gram Stain   Final    MODERATE WBC PRESENT, PREDOMINANTLY PMN FEW SQUAMOUS EPITHELIAL CELLS PRESENT ABUNDANT GRAM NEGATIVE RODS ABUNDANT GRAM POSITIVE RODS ABUNDANT GRAM POSITIVE COCCI IN PAIRS    Culture   Final    NORMAL OROPHARYNGEAL FLORA Performed at [facility]  Report Status PENDING  Incomplete     Labs: Basic Metabolic Panel:  Recent Labs Lab 03/18/15 0620 03/18/15 1650 03/19/15 0410 03/20/15 0426  NA 130* 130* 131* 134*  K 4.2 4.1 4.1 4.2  CL 96* 94* 95* 96*  CO2 24 27 28 30   GLUCOSE 145* 171* 166* 140*  BUN 16 20 21* 33*  CREATININE 0.85 1.03 0.84 0.87  CALCIUM 8.9 9.2 9.3 9.5   Liver Function Tests:  Recent Labs Lab 03/18/15 1650  AST 30  ALT 23  ALKPHOS 57  BILITOT 0.5  PROT 7.3  ALBUMIN 4.0   No results for input(s): LIPASE, AMYLASE in the last 168 hours. No results for input(s): AMMONIA in the last 168 hours. CBC:  Recent Labs Lab 03/18/15 0620 03/19/15 0410 03/20/15 0426  WBC 16.9* 17.4* 20.4*  NEUTROABS 16.0*  --   --   HGB 14.3 14.0 14.2  HCT 41.5 40.7 42.2  MCV 87.2 87.2 87.0  PLT 281 304 331   Cardiac Enzymes:  Recent Labs Lab 03/18/15 0620  TROPONINI 0.03   BNP: BNP (last 3 results)  Recent Labs  03/18/15 0600  BNP 127.9*     SIGNED: Time coordinating discharge: 30 minutes  Debbora PrestoMAGICK-Junior Kenedy, MD  [facility] 03/20/2015, 9:24 AM Pager [phone]  If 7PM-7AM, please contact night-coverage [email or web address] Password [email or web address]

## 2015-03-21 LAB — CULTURE, RESPIRATORY W GRAM STAIN: Culture: NORMAL

## 2015-03-21 LAB — CULTURE, RESPIRATORY

## 2015-04-06 ENCOUNTER — Ambulatory Visit (INDEPENDENT_AMBULATORY_CARE_PROVIDER_SITE_OTHER): Payer: Medicare Other | Admitting: Pulmonary Disease

## 2015-04-06 ENCOUNTER — Encounter: Payer: Self-pay | Admitting: Pulmonary Disease

## 2015-04-06 VITALS — BP 130/64 | HR 99 | Temp 97.4°F | Ht 73.0 in | Wt 248.0 lb

## 2015-04-06 DIAGNOSIS — F17200 Nicotine dependence, unspecified, uncomplicated: Secondary | ICD-10-CM

## 2015-04-06 DIAGNOSIS — K219 Gastro-esophageal reflux disease without esophagitis: Secondary | ICD-10-CM

## 2015-04-06 DIAGNOSIS — Z720 Tobacco use: Secondary | ICD-10-CM

## 2015-04-06 DIAGNOSIS — R011 Cardiac murmur, unspecified: Secondary | ICD-10-CM

## 2015-04-06 DIAGNOSIS — R0600 Dyspnea, unspecified: Secondary | ICD-10-CM

## 2015-04-06 MED ORDER — RANITIDINE HCL 150 MG PO TABS: 150.0000 mg | ORAL_TABLET | Freq: Every day | ORAL | Status: AC

## 2015-04-06 MED ORDER — ESOMEPRAZOLE MAGNESIUM 40 MG PO CPDR: 40.0000 mg | DELAYED_RELEASE_CAPSULE | Freq: Every day | ORAL | Status: AC

## 2015-04-06 MED ORDER — BUDESONIDE-FORMOTEROL FUMARATE 80-4.5 MCG/ACT IN AERO: 2.0000 | INHALATION_SPRAY | Freq: Two times a day (BID) | RESPIRATORY_TRACT | Status: AC

## 2015-04-06 NOTE — Patient Instructions (Signed)
1. I have sent in prescriptions to your pharmacy for Nexium to take in the morning and Zantac to take at night. This will help control her reflux. 2. You can continue taking your albuterol inhaler and nebulizer on an as-needed basis. 3. We are starting Symbicort to help her breathing. I have sent a prescription to your pharmacy but they will not fill this prescription until you called for it because your sample will last you for a week or so.  4. I'm checking an ultrasound of your heart to evaluate her heart murmur. 5. We are going to arrange for breathing test to be performed before your next appointment to see how well your lungs are functioning. 6. Please remember to rinse, gargle, spit, and pressure teeth after use her Symbicort to prevent getting thrush in her mouth or on your tongue. 7. I will see  You back in 4-6 weeks but please call my office if you have any further questions or concerns.

## 2015-04-06 NOTE — Progress Notes (Signed)
Subjective:    Patient ID: Joseph Nicholson, male    DOB: 1946/07/09, 69 y.o.   MRN: [id number]  HPI In reviewing the patient's hospital medical records he was recently hospitalized for acute exacerbation of COPD for a short period. He was treated and discharged on Levaquin & prednisone.  He reports that over the last 8 months he has had episodes at night while sleeping where he would wake up with dyspnea that progressively worsened. He denies any lower extremity edema or cough. He did have orthopnea. He was prescribed Ambien and had no further nocturnal awakenings. He reports that starting on 7/9 he woke up with a nonproductive cough that progressed through the day.  He was golfing outdoors and symptoms seem to progress. He reports the evening of 7/9 he was too exhausted to eat.  He reports that his dyspnea progressively worsened but was more significant in the evening. He acutely worsened the evening of 7/9 and EMS was called initially but was transported to hospital because he "couldn't get any air". He reports that nearly immediately with a nebulizer treatment and oxygen in the ambulance his dyspnea improved significantly. He reports that since his discharge he has continued to exercise but hasn't been "pressing himself". He does notice that he has an intermittent cough that is worse with talking. He reports his cough is productive of a "clear, white" mucus. He denies any recent wheezing. He reports his mucus production has declined nearly "70%" and seems to be predominantly in the morning. He reporte he is using his nebulizer 3-4 times daily. He reports since his discharge he has had increased epigastric discomfort & reflux but denies any morning brash water taste. He has been taking Nexium  every morning since discharge. No abdominal pain or diarrhea. No fever, chills, or sweats.  Review of Systems No sinus congestion, pressure, or post-nasal drainage. No urinary hesitancy, pain, or hematuria. A  pertinent 14 point review of systems is negative except as per the history of presenting illness.  No Known Allergies  Current Outpatient Prescriptions on File Prior to Visit  Medication Sig Dispense Refill  . albuterol (VENTOLIN HFA) 108 (90 BASE) MCG/ACT inhaler Inhale 1-2 puffs into the lungs every 4 (four) hours as needed for wheezing or shortness of breath. 1 Inhaler 5  . guaiFENesin (MUCINEX) 600 MG 12 hr tablet Take 1,200 mg by mouth once.     Marland Kitchen guaiFENesin-dextromethorphan (ROBITUSSIN DM) 100-10 MG/5ML syrup Take 5 mLs by mouth every 4 (four) hours as needed for cough. 118 mL 0  . levalbuterol (XOPENEX) 1.25 MG/0.5ML nebulizer solution Take 1.25 mg by nebulization every 3 (three) hours as needed for wheezing or shortness of breath. 1 each 5  . lisinopril-hydrochlorothiazide (PRINZIDE,ZESTORETIC) 10-12.5 MG per tablet Take 1 tablet by mouth daily. 30 tablet 5  . oxymetazoline (AFRIN NASAL SPRAY) 0.05 % nasal spray Place 1 spray into both nostrils 2 (two) times daily as needed for congestion.    Marland Kitchen zolpidem (AMBIEN) 10 MG tablet Take 1 tablet (10 mg total) by mouth at bedtime as needed for sleep. 30 tablet 0   No current facility-administered medications on file prior to visit.   Past Medical History  Diagnosis Date  . Hypertension   . GERD (gastroesophageal reflux disease)   . Heart murmur    Past Surgical History  Procedure Laterality Date  . Appendectomy    . Hernia repair Bilateral     inguinal  . Tonsillectomy     Family History  Problem Relation Age of Onset  . Cancer Mother     Brain  . Coronary artery disease Father   . Lung disease Neg Hx    History   Social History  . Marital Status: Single    Spouse Name: N/A  . Number of Children: N/A  . Years of Education: N/A   Occupational History  . Media planner    Social History Main Topics  . Smoking status: Current Some Day Smoker  . Smokeless tobacco: Never Used  . Alcohol Use: 1.2 oz/week    2  Standard drinks or equivalent per week  . Drug Use: Not on file  . Sexual Activity: Not on file   Other Topics Concern  . None   Social History Narrative   He was born in [place]. He has lived in [place], [place], [place]. He is currently single. 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 He served in [place] & [place]. He has also traveled to [place] TB exposure as a child. 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 No bird or mold exposure. No asbestos exposure. 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       Objective:   Physical Exam Blood pressure 130/64, pulse 99, temperature 97.4 F (36.3 C), temperature source Oral, height 6\' 1"  (1.854 m), weight 248 lb (112.492 kg), SpO2 97 %. General:  Awake. Alert. No acute distress. Caucasian male.  Integument:  Warm & dry. No rash on exposed skin. No bruising on exposed skin. Lymphatics:  No appreciated cervical or supraclavicular lymphadenoapthy. HEENT:  Moist mucus membranes. No oral ulcers. No scleral injection or icterus. Minimal nasal turbinate swelling. PERRL. Cardiovascular:  Regular rate & rhythm. 3/6 systolic ejection murmur appreciated at the right upper sternal border. No edema. No appreciable JVD.  Pulmonary:  Good aeration & clear to auscultation bilaterally with prolonged exhalation phase. Symmetric chest wall expansion. No accessory muscle use. Abdomen: Soft. Normal bowel sounds. Nondistended. Grossly nontender. Musculoskeletal:  Normal bulk and tone. Hand grip strength 5/5 bilaterally. No joint deformity or effusion appreciated. Neurological:  CN 2-12 grossly in tact. No meningismus. Moving all 4 extremities equally. Symmetric patellar deep tendon reflexes. Psychiatric:  Mood and affect congruent. Speech normal rhythm, rate & tone.   IMAGING CXR PA/LAT 03/18/15 (personally reviewed by me): Hyperinflation with flattening of the diaphragms. No pleural effusion or  cardiomegaly appreciated. No parenchymal mass or consolidation appreciated.  LABS 03/20/15 BMP: 134/4.2/96/30/33/0.87/140/9.5 CBC: 20.4/14.2/42.2/331  03/18/15 BNP: 127.9 (normal)  MICROBIOLOGY 03/18/15 Urine streptococcal antigen: Negative Urologic Legionella antigen: Negative Respiratory culture: Usual oral flora     Assessment & Plan:  69 year old male with a long history of tobacco use and exposure to inhaled chemical fumes. Patient also has questionable remote exposure to tuberculosis through a friend as a child. He has never been treated for TB or had TB per his report. I spent a significant amount of time today discussing the pathophysiology of COPD as it relates to tobacco use. His chest x-ray does certainly suggest this as a possible etiology for his acute decompensation, but on a function testing will need to be performed to confirm this diagnosis. Additionally complicating the clinical picture is his ongoing reflux which seems to be more pronounced after his recent course of prednisone. He is aggressively trying to quit smoking with use of nicotine patches and I believe the 21 mg patch may be in excess of what he requires for  nicotine replacement and causing some of his sense of anxiety and agitation. I instructed the patient to contact my office if he any further questions or concerns before his next appointment.  1. Dyspnea: Suspect underlying COPD. Checking for pulmonary function testing as well as 6 minute walk test before next appointment. Continuing empiric treatment of COPD with Symbicort 80/4.5 & when necessary albuterol via inhaler and nebulizer. Patient instructed on proper oral hygiene and technique by nursing staff with Symbicort today. 2. GERD: Patient counseled on appropriate lifestyle modifications. Continuing Nexium 40 mg by mouth daily as well as Zantac 150 mg by mouth daily at bedtime. 3. Tobacco use disorder: Patient counseled to try using 14 mg/24 hour patches and  nicotine lozenges/gum for intermittent cravings. 4. Systolic heart murmur: Checking transthoracic echocardiogram. 5. Health maintenance: Plan to address immunization status at next appointment. 6. Follow-up: Patient to return to clinic in 4-6 weeks or sooner if needed.

## 2015-04-09 ENCOUNTER — Other Ambulatory Visit: Payer: Self-pay | Admitting: Internal Medicine

## 2015-04-16 ENCOUNTER — Telehealth: Payer: Self-pay | Admitting: Pulmonary Disease

## 2015-04-16 NOTE — Telephone Encounter (Signed)
Per 04/06/15: Dyspnea: Suspect underlying COPD. Checking for pulmonary function testing as well as 6 minute walk test before next appointment. Continuing empiric treatment of COPD with Symbicort 80/4.5 & when necessary albuterol via inhaler and nebulizer. Patient instructed on proper oral hygiene and technique by nursing staff with Symbicort today ---  Called spoke with pt. He saw PCP today for f/u. She was told to stop the albuterol inhaler. I advised him per Dr. Jamison Neighbor he can take these PRN. I made pt aware of this and he needed nothing further.

## 2015-04-17 ENCOUNTER — Telehealth: Payer: Self-pay | Admitting: Pulmonary Disease

## 2015-04-17 NOTE — Telephone Encounter (Signed)
Patient (8/9@ 10:06am [facility] [place] [facility] will [facility] all the costs,) for the Echo.

## 2015-04-18 ENCOUNTER — Other Ambulatory Visit (HOSPITAL_COMMUNITY): Payer: Medicare Other

## 2015-05-21 ENCOUNTER — Ambulatory Visit: Payer: Medicare Other | Admitting: Pulmonary Disease

## 2015-05-21 ENCOUNTER — Ambulatory Visit: Payer: Medicare Other

## 2015-06-13 ENCOUNTER — Telehealth: Payer: Self-pay | Admitting: *Deleted

## 2015-06-13 ENCOUNTER — Ambulatory Visit (INDEPENDENT_AMBULATORY_CARE_PROVIDER_SITE_OTHER): Payer: Medicare Other | Admitting: Podiatry

## 2015-06-13 ENCOUNTER — Encounter: Payer: Self-pay | Admitting: Podiatry

## 2015-06-13 VITALS — Ht 74.0 in | Wt 245.0 lb

## 2015-06-13 DIAGNOSIS — B351 Tinea unguium: Secondary | ICD-10-CM

## 2015-06-13 DIAGNOSIS — M79609 Pain in unspecified limb: Principal | ICD-10-CM

## 2015-06-13 DIAGNOSIS — M79673 Pain in unspecified foot: Secondary | ICD-10-CM | POA: Diagnosis not present

## 2015-06-13 NOTE — Telephone Encounter (Addendum)
Pt states he was seen in the [place] office and it was suggested he take Gabapentin. Pt called 35 minutes later and requested a letter to the [facility].  Left message informing pt he should call again.  Pt states if we would send a note, concerning the medication Dr. Stacie Acres suggested and the office visit to the [facility] fax [phone] Attn: Dr. Benjaman Pott.  Pt called request Gabapentin rx sent to [facility].  I left message informing pt, a letter was faxed to [facility] as he requested suggesting the primary doctor order the Gabapentin.

## 2015-06-13 NOTE — Progress Notes (Signed)
   Subjective:    Patient ID: Joseph Nicholson, male    DOB: 10/22/1945, 69 y.o.   MRN: [id number]  HPIThis patient presents to the office for treatment of his long thick nails.  He says his nails are painful walking and wearing his shoes.  [facility].  He says he was given lamisil for the nails by the [place].He admits to heart problems and COPD.  No diabetes.    Review of Systems  Constitutional: Positive for fatigue.  HENT: Positive for sneezing.   Respiratory: Positive for wheezing.        COPD- moderate Difficulty breathing   Cardiovascular:       Valve problem  Skin:       Change in nails   Neurological: Positive for numbness.  Hematological: Bruises/bleeds easily.       Objective:   Physical Exam GENERAL APPEARANCE: Alert, conversant. Appropriately groomed. No acute distress.  VASCULAR: Pedal pulses palpable at  Oper[facility] Room [employer] and PT bilateral.  Capillary refill time is immediate to all digits,  Normal temperature gradient.  Digital hair growth is present bilateral  NEUROLOGIC: sensation is normal to 5.07 monofilament at 5/5 sites bilateral.  Light touch is intact bilateral, Muscle strength normal.  MUSCULOSKELETAL: acceptable muscle strength, tone and stability bilateral.  Intrinsic muscluature intact bilateral.  Rectus appearance of foot and digits noted bilateral.   DERMATOLOGIC: skin color, texture, and turgor are within normal limits.  No preulcerative lesions or ulcers  are seen, no interdigital maceration noted.  No open lesions present. . No drainage noted.  Thick disfigured discolored nails both feet all toes.        Assessment & Plan:  Onychomycosis  B/L  IE  Debridement of long thick painful nails. RTC 3 months. He was given the name gabapentin to discuss with his medical doctor his numbness foot symptomatology.

## 2015-06-14 ENCOUNTER — Encounter: Payer: Self-pay | Admitting: *Deleted

## 2015-06-14 NOTE — Telephone Encounter (Deleted)
Entered in error

## 2015-06-26 ENCOUNTER — Telehealth: Payer: Self-pay | Admitting: *Deleted

## 2015-06-26 NOTE — Telephone Encounter (Signed)
Pt left name, DOB and phone number.  Pt states he [place] [facility] [place] [facility] [place] [facility] [place] and they will be mailing it to him.  Pt thanked me for my help.

## 2015-07-06 ENCOUNTER — Telehealth: Payer: Self-pay | Admitting: *Deleted

## 2015-07-06 NOTE — Telephone Encounter (Signed)
Pt states he has read the flyer that accompanies the Gabapentin the [facility] [place] had [facility] to him.  Pt states Dr. Stacie AcresMayer had suggested the medication and he had concerns about taking the medication after reading the flyer.  Left message informing pt that we had pts that did very well on the Gabapentin without side effects, but to get the best information concerning his therapy and his other medications he needed to speak either with his prescribing doctor or the pharmacy, that if he had any other foot related questions I would be happy to help.

## 2015-09-13 ENCOUNTER — Ambulatory Visit: Payer: Medicare Other | Admitting: Podiatry

## 2015-10-11 ENCOUNTER — Encounter (HOSPITAL_COMMUNITY)
Admission: RE | Admit: 2015-10-11 | Discharge: 2015-10-11 | Disposition: A | Discharge status: Home / Self Care | Payer: No Typology Code available for payment source | Source: Ambulatory Visit | Attending: Cardiology | Admitting: Cardiology

## 2015-10-11 DIAGNOSIS — Z950 Presence of cardiac pacemaker: Secondary | ICD-10-CM | POA: Insufficient documentation

## 2015-10-11 DIAGNOSIS — Z952 Presence of prosthetic heart valve: Secondary | ICD-10-CM | POA: Insufficient documentation

## 2015-10-15 ENCOUNTER — Encounter (HOSPITAL_COMMUNITY): Payer: Self-pay

## 2015-10-15 ENCOUNTER — Encounter (HOSPITAL_COMMUNITY)
Admission: RE | Admit: 2015-10-15 | Discharge: 2015-10-15 | Disposition: A | Discharge status: Home / Self Care | Payer: No Typology Code available for payment source | Source: Ambulatory Visit | Attending: Cardiology | Admitting: Cardiology

## 2015-10-15 DIAGNOSIS — Z950 Presence of cardiac pacemaker: Secondary | ICD-10-CM | POA: Diagnosis not present

## 2015-10-15 DIAGNOSIS — Z952 Presence of prosthetic heart valve: Secondary | ICD-10-CM | POA: Diagnosis not present

## 2015-10-15 NOTE — Progress Notes (Signed)
Pt started cardiac rehab today.  Pt tolerated light exercise without difficulty. VSS, telemetry-sinus rhythm,rare PVC,  asymptomatic.  Medication list reconciled.  Pt verbalized compliance with medications and denies barriers to compliance, however pt is not familiar with medication names, his wife prepares his medications for him. Pt continues to smoke 4 cigarettes/day. Pt reports this is down from 1 pack per day prior to hospitalization.  Pt states he smokes only during the day, not at night.  Pt is wearing nicotine patch.  Pt advised to contact 1800quitnow. Pt verbalizes readiness to stop smoking and states he has family support to assist him.   PSYCHOSOCIAL ASSESSMENT:  PHQ-0. Pt exhibits positive coping skills, hopeful outlook with supportive family, pt wife is present with him today at rehab. No psychosocial needs identified at this time, no psychosocial interventions necessary.    Pt enjoys golf.   Pt cardiac rehab  goal is  to increase strength and stamina.  Pt encouraged to participate in home exercise in addition to cardiac rehab activities to increase ability to achieve these goals.  Pt would also like to quit smoking.  Pt oriented to exercise equipment and routine.  Understanding verbalized.

## 2015-10-17 ENCOUNTER — Encounter (HOSPITAL_COMMUNITY): Payer: No Typology Code available for payment source

## 2015-10-19 ENCOUNTER — Encounter (HOSPITAL_COMMUNITY)
Admission: RE | Admit: 2015-10-19 | Discharge: 2015-10-19 | Disposition: A | Discharge status: Home / Self Care | Payer: No Typology Code available for payment source | Source: Ambulatory Visit | Attending: Cardiology | Admitting: Cardiology

## 2015-10-19 DIAGNOSIS — Z952 Presence of prosthetic heart valve: Secondary | ICD-10-CM | POA: Diagnosis not present

## 2015-10-19 NOTE — Progress Notes (Signed)
QUALITY OF LIFE SCORE REVIEW  Pt completed Quality of Life survey as a participant in Cardiac Rehab. Scores 21.0 or below are considered low. Pt scored  low in a few  Areas: Overall 22, Health and Function 15, socioeconomic 30, physiological and spiritual 28, family 20. Overall pt scores are satisfactory.  Patient quality of life is altered by physical constraints which limits ability to perform as prior to recent cardiac illness.  Pt is concerned about his decline in functional ability and shortness of breath.  In addition to CAD, pt has COPD which alters his breathing.  Pt instructed in purse lip breathing technique during exercise.  Pt is discouraged by his functional decline which inhibits his ability to participate in pleasurable activities.  It has  been over 6 months since pt has been able to enjoy his favorite pastime golf.  Pt is looking forward to returning to this soon. Pt is also looking forward to returning to his usual workout routine at the gym.  Pt has been instructed to incorporate stair climbing while walking track at cardiac rehab, which pt is looking forward to increasing his functional ability.   Offered emotional support and reassurance.  Will continue to monitor and intervene as necessary.

## 2015-10-22 ENCOUNTER — Encounter (HOSPITAL_COMMUNITY)
Admission: RE | Admit: 2015-10-22 | Discharge: 2015-10-22 | Disposition: A | Discharge status: Home / Self Care | Payer: No Typology Code available for payment source | Source: Ambulatory Visit | Attending: Cardiology | Admitting: Cardiology

## 2015-10-22 DIAGNOSIS — Z952 Presence of prosthetic heart valve: Secondary | ICD-10-CM | POA: Diagnosis not present

## 2015-10-24 ENCOUNTER — Encounter (HOSPITAL_COMMUNITY)
Admission: RE | Admit: 2015-10-24 | Discharge: 2015-10-24 | Disposition: A | Discharge status: Home / Self Care | Payer: No Typology Code available for payment source | Source: Ambulatory Visit | Attending: Cardiology | Admitting: Cardiology

## 2015-10-24 DIAGNOSIS — Z952 Presence of prosthetic heart valve: Secondary | ICD-10-CM | POA: Diagnosis not present

## 2015-10-24 NOTE — Progress Notes (Signed)
Pt fiance reported pt has discontinued coumadin per Dr. Nevada Crane.  Med list reconciled.

## 2015-10-26 ENCOUNTER — Encounter (HOSPITAL_COMMUNITY)
Admission: RE | Admit: 2015-10-26 | Discharge: 2015-10-26 | Disposition: A | Discharge status: Home / Self Care | Payer: No Typology Code available for payment source | Source: Ambulatory Visit | Attending: Cardiology | Admitting: Cardiology

## 2015-10-26 DIAGNOSIS — Z952 Presence of prosthetic heart valve: Secondary | ICD-10-CM | POA: Diagnosis not present

## 2015-10-29 ENCOUNTER — Encounter (HOSPITAL_COMMUNITY)
Admission: RE | Admit: 2015-10-29 | Discharge: 2015-10-29 | Disposition: A | Discharge status: Home / Self Care | Payer: No Typology Code available for payment source | Source: Ambulatory Visit | Attending: Cardiology | Admitting: Cardiology

## 2015-10-29 DIAGNOSIS — Z952 Presence of prosthetic heart valve: Secondary | ICD-10-CM | POA: Diagnosis not present

## 2015-10-29 NOTE — Progress Notes (Signed)
Pt arrived at cardiac rehab with multiple questions about pacemaker purpose and function.  Pt c/o discomfort at device pocket site.  Generalized PPM questions answered. Pt instructed to request evaluation for his discomfort at device site specific device setting questions and medication questions  at his device clinic appt tomorrow.  Pt questions written down for him.  Pt verbalized understanding and reassurance with questions answered today.

## 2015-10-29 NOTE — Progress Notes (Signed)
Reviewed home exercise guidelines with patient including endpoints, temperature precautions, target heart rate and rate of perceived exertion. Pt is currently exercising at [employer] 2 days/week for 30 minutes as his mode of home exercise. Pt voices understanding of instructions given. Artist Pais, MS, ACSM CCEP

## 2015-10-31 ENCOUNTER — Encounter (HOSPITAL_COMMUNITY)
Admission: RE | Admit: 2015-10-31 | Discharge: 2015-10-31 | Disposition: A | Discharge status: Home / Self Care | Payer: No Typology Code available for payment source | Source: Ambulatory Visit | Attending: Cardiology | Admitting: Cardiology

## 2015-10-31 DIAGNOSIS — Z952 Presence of prosthetic heart valve: Secondary | ICD-10-CM | POA: Diagnosis not present

## 2015-11-02 ENCOUNTER — Encounter (HOSPITAL_COMMUNITY)
Admission: RE | Admit: 2015-11-02 | Discharge: 2015-11-02 | Disposition: A | Discharge status: Home / Self Care | Payer: No Typology Code available for payment source | Source: Ambulatory Visit | Attending: Cardiology | Admitting: Cardiology

## 2015-11-02 DIAGNOSIS — Z952 Presence of prosthetic heart valve: Secondary | ICD-10-CM | POA: Diagnosis not present

## 2015-11-05 ENCOUNTER — Encounter (HOSPITAL_COMMUNITY)
Admission: RE | Admit: 2015-11-05 | Discharge: 2015-11-05 | Disposition: A | Discharge status: Home / Self Care | Payer: No Typology Code available for payment source | Source: Ambulatory Visit | Attending: Cardiology | Admitting: Cardiology

## 2015-11-05 DIAGNOSIS — Z952 Presence of prosthetic heart valve: Secondary | ICD-10-CM | POA: Diagnosis not present

## 2015-11-07 ENCOUNTER — Encounter (HOSPITAL_COMMUNITY)
Admission: RE | Admit: 2015-11-07 | Discharge: 2015-11-07 | Disposition: A | Discharge status: Home / Self Care | Payer: No Typology Code available for payment source | Source: Ambulatory Visit | Attending: Cardiology | Admitting: Cardiology

## 2015-11-07 DIAGNOSIS — Z952 Presence of prosthetic heart valve: Secondary | ICD-10-CM | POA: Diagnosis not present

## 2015-11-07 DIAGNOSIS — Z950 Presence of cardiac pacemaker: Secondary | ICD-10-CM | POA: Insufficient documentation

## 2015-11-09 ENCOUNTER — Encounter (HOSPITAL_COMMUNITY)
Admission: RE | Admit: 2015-11-09 | Discharge: 2015-11-09 | Disposition: A | Discharge status: Home / Self Care | Payer: No Typology Code available for payment source | Source: Ambulatory Visit | Attending: Cardiology | Admitting: Cardiology

## 2015-11-09 DIAGNOSIS — Z952 Presence of prosthetic heart valve: Secondary | ICD-10-CM | POA: Diagnosis not present

## 2015-11-12 ENCOUNTER — Encounter (HOSPITAL_COMMUNITY)
Admission: RE | Admit: 2015-11-12 | Discharge: 2015-11-12 | Disposition: A | Discharge status: Home / Self Care | Payer: No Typology Code available for payment source | Source: Ambulatory Visit | Attending: Cardiology | Admitting: Cardiology

## 2015-11-12 DIAGNOSIS — Z952 Presence of prosthetic heart valve: Secondary | ICD-10-CM | POA: Diagnosis not present

## 2015-11-14 ENCOUNTER — Encounter (HOSPITAL_COMMUNITY)
Admission: RE | Admit: 2015-11-14 | Discharge: 2015-11-14 | Disposition: A | Discharge status: Home / Self Care | Payer: No Typology Code available for payment source | Source: Ambulatory Visit | Attending: Cardiology | Admitting: Cardiology

## 2015-11-14 DIAGNOSIS — Z952 Presence of prosthetic heart valve: Secondary | ICD-10-CM | POA: Diagnosis not present

## 2015-11-16 ENCOUNTER — Encounter (HOSPITAL_COMMUNITY)
Admission: RE | Admit: 2015-11-16 | Discharge: 2015-11-16 | Disposition: A | Discharge status: Home / Self Care | Payer: No Typology Code available for payment source | Source: Ambulatory Visit | Attending: Cardiology | Admitting: Cardiology

## 2015-11-16 DIAGNOSIS — Z952 Presence of prosthetic heart valve: Secondary | ICD-10-CM | POA: Diagnosis not present

## 2015-11-19 ENCOUNTER — Encounter (HOSPITAL_COMMUNITY)
Admission: RE | Admit: 2015-11-19 | Discharge: 2015-11-19 | Disposition: A | Discharge status: Home / Self Care | Payer: No Typology Code available for payment source | Source: Ambulatory Visit | Attending: Cardiology | Admitting: Cardiology

## 2015-11-19 DIAGNOSIS — Z952 Presence of prosthetic heart valve: Secondary | ICD-10-CM | POA: Diagnosis not present

## 2015-11-21 ENCOUNTER — Encounter (HOSPITAL_COMMUNITY)
Admission: RE | Admit: 2015-11-21 | Discharge: 2015-11-21 | Disposition: A | Discharge status: Home / Self Care | Payer: No Typology Code available for payment source | Source: Ambulatory Visit | Attending: Cardiology | Admitting: Cardiology

## 2015-11-21 DIAGNOSIS — Z952 Presence of prosthetic heart valve: Secondary | ICD-10-CM | POA: Diagnosis not present

## 2015-11-22 ENCOUNTER — Emergency Department (HOSPITAL_COMMUNITY): Payer: No Typology Code available for payment source

## 2015-11-22 ENCOUNTER — Emergency Department (HOSPITAL_COMMUNITY)
Admission: EM | Admit: 2015-11-22 | Discharge: 2015-11-22 | Disposition: A | Discharge status: Home / Self Care | Payer: No Typology Code available for payment source | Attending: Emergency Medicine | Admitting: Emergency Medicine

## 2015-11-22 ENCOUNTER — Encounter (HOSPITAL_COMMUNITY): Payer: Self-pay | Admitting: Nurse Practitioner

## 2015-11-22 DIAGNOSIS — Z7901 Long term (current) use of anticoagulants: Secondary | ICD-10-CM | POA: Diagnosis not present

## 2015-11-22 DIAGNOSIS — Z954 Presence of other heart-valve replacement: Secondary | ICD-10-CM | POA: Diagnosis not present

## 2015-11-22 DIAGNOSIS — I10 Essential (primary) hypertension: Secondary | ICD-10-CM | POA: Insufficient documentation

## 2015-11-22 DIAGNOSIS — R011 Cardiac murmur, unspecified: Secondary | ICD-10-CM | POA: Diagnosis not present

## 2015-11-22 DIAGNOSIS — K219 Gastro-esophageal reflux disease without esophagitis: Secondary | ICD-10-CM | POA: Insufficient documentation

## 2015-11-22 DIAGNOSIS — F17200 Nicotine dependence, unspecified, uncomplicated: Secondary | ICD-10-CM | POA: Diagnosis not present

## 2015-11-22 DIAGNOSIS — Z79899 Other long term (current) drug therapy: Secondary | ICD-10-CM | POA: Diagnosis not present

## 2015-11-22 DIAGNOSIS — R002 Palpitations: Secondary | ICD-10-CM | POA: Insufficient documentation

## 2015-11-22 DIAGNOSIS — Z951 Presence of aortocoronary bypass graft: Secondary | ICD-10-CM | POA: Insufficient documentation

## 2015-11-22 DIAGNOSIS — Z7982 Long term (current) use of aspirin: Secondary | ICD-10-CM | POA: Insufficient documentation

## 2015-11-22 DIAGNOSIS — Z952 Presence of prosthetic heart valve: Secondary | ICD-10-CM | POA: Insufficient documentation

## 2015-11-22 DIAGNOSIS — Z950 Presence of cardiac pacemaker: Secondary | ICD-10-CM | POA: Insufficient documentation

## 2015-11-22 LAB — CBC
HEMATOCRIT: 40 % (ref 39.0–52.0)
HEMOGLOBIN: 13.6 g/dL (ref 13.0–17.0)
MCH: 28.2 pg (ref 26.0–34.0)
MCHC: 34 g/dL (ref 30.0–36.0)
MCV: 82.8 fL (ref 78.0–100.0)
Platelets: 295 10*3/uL (ref 150–400)
RBC: 4.83 MIL/uL (ref 4.22–5.81)
RDW: 15.7 % — AB (ref 11.5–15.5)
WBC: 9.2 10*3/uL (ref 4.0–10.5)

## 2015-11-22 LAB — BASIC METABOLIC PANEL
ANION GAP: 11 (ref 5–15)
BUN: 17 mg/dL (ref 6–20)
CALCIUM: 9.6 mg/dL (ref 8.9–10.3)
CO2: 24 mmol/L (ref 22–32)
CREATININE: 1.1 mg/dL (ref 0.61–1.24)
Chloride: 101 mmol/L (ref 101–111)
GFR calc Af Amer: >60 mL/min (ref >60)
GFR calc non Af Amer: >60 mL/min (ref >60)
GLUCOSE: 123 mg/dL — AB (ref 65–99)
Potassium: 4.1 mmol/L (ref 3.5–5.1)
Sodium: 136 mmol/L (ref 135–145)

## 2015-11-22 LAB — I-STAT TROPONIN, ED: TROPONIN I, POC: 0.01 ng/mL (ref 0.00–0.08)

## 2015-11-22 NOTE — ED Notes (Signed)
He c/o 1 hour history of intermittent "heart fluttering." Denies cp, sob. He is A&Ox4, resp e/u

## 2015-11-22 NOTE — Discharge Instructions (Signed)

## 2015-11-22 NOTE — Consult Note (Signed)
Reason for Consult: palpitations Requesting Physician: Dr. Judd LienELO  HPI: Mr. Joseph Nicholson is a 70 year old engaged moderately overweight Caucasian male father of 3, grandfather and 3 grandchildren who we are asked to see because of palpitations. [facility] in July for a COPD exacerbation. His past medical history is remarkable for over 50 pack years of tobacco abuse having recently decided to try to quit. He smoking 4 cigarettes a day currently and has a 21 mg nicotine patch on. He does have a history of hypertension. He had a chronic catheterization performed at to [facility] late last revealed normal coronary arteries and he subsequently underwent an aortic valve replacement by Dr. Nevada CraneKon with a bioprosthetic valve [facility] in December. Several days later he required placement of a permanent transvenous pacemaker. He is not on Coumadin anticoagulation. He has been doing well since that time. He [facility] cardiac [facility] times a week. He is otherwise asymptomatic and says his breathing is much better. Today he noticed palpitations in the setting of a 21 mg nicotine patch and several glasses of tea. He came to the emergency room where his EKG was paced with PVCs. His chest x-ray showed no active disease. His laboratory exam is unremarkable. His enzymes were negative. His exam was benign. He currently feels clinically improved. His rhythm is ventricularly paced.  Problem List: Patient Active Problem List   Diagnosis Date Noted  . Tobacco use disorder 04/06/2015  . Heart murmur 04/06/2015  . Dyspnea 03/18/2015  . History of orthopnea 03/18/2015    PMHx:  Past Medical History  Diagnosis Date  . Hypertension   . GERD (gastroesophageal reflux disease)   . Heart murmur    Past Surgical History  Procedure Laterality Date  . Appendectomy    . Hernia repair Bilateral     inguinal  .  Tonsillectomy    . Pacemaker insertion    . Coronary artery bypass graft      FAMHx: Family History  Problem Relation Age of Onset  . Cancer Mother     Brain  . Coronary artery disease Father   . Lung disease Neg Hx     SOCHx:  reports that he has been smoking.  He has never used smokeless tobacco. He reports that he drinks about 1.2 oz of alcohol per week. He reports that he does not use illicit drugs.  ALLERGIES: No Known Allergies  ROS: Pertinent items are noted in HPI.  HOME MEDICATIONS:  (Not in a hospital admission)  HOSPITAL MEDICATIONS: I have reviewed the patient's current medications.  VITALS: Blood pressure 122/56, pulse 81, temperature 98.2 F (36.8 C), temperature source Oral, resp. rate 18, SpO2 98 %.  INPUT/OUTPUT        PHYSICAL EXAM: General appearance: alert and no distress Neck: no adenopathy, no carotid bruit, no JVD, supple, symmetrical, trachea midline and thyroid not enlarged, symmetric, no tenderness/mass/nodules Lungs: clear to auscultation bilaterally Heart: regular rate and rhythm, S1, S2 normal, no murmur, click, rub or gallop Extremities: extremities normal, atraumatic, no cyanosis or edema  LABS:  BMP  Recent Labs  03/19/15 0410 03/20/15 0426 11/22/15 1430  NA 131* 134* 136  K 4.1 4.2 4.1  CL 95* 96* 101  CO2 28 30 24   GLUCOSE 166* 140* 123*  BUN 21* 33* 17  CREATININE 0.84 0.87 1.10  CALCIUM 9.3 9.5 9.6  GFRNONAA >60 >60 >60  GFRAA >60 >60 >60    CBC  Recent Labs Lab 11/22/15 1430  WBC 9.2  RBC 4.83  HGB 13.6  HCT 40.0  PLT 295  MCV 82.8    HEMOGLOBIN A1C Lab Results  Component Value Date   HGBA1C 5.8* 03/18/2015   MPG 120 03/18/2015    Cardiac Panel (last 3 results)  Recent Labs  03/18/15 0620  TROPONINI 0.03    BNP (last 3 results) No results for input(s): PROBNP in the last 8760 hours.  TSH No results for input(s): TSH in the last 8760 hours.  CHOLESTEROL No results for input(s):  CHOL in the last 8760 hours.  Hepatic Function Panel  Recent Labs  03/18/15 1650  PROT 7.3  ALBUMIN 4.0  AST 30  ALT 23  ALKPHOS 57  BILITOT 0.5    IMAGING: Dg Chest 2 View  11/22/2015  CLINICAL DATA:  1 hour intermittent heart fluttering. EXAM: CHEST  2 VIEW COMPARISON:  March 18, 2015 FINDINGS: The heart size and mediastinal contours are stable. Patient status post prior CABG and median sternotomy. Cardiac pacemaker is identified with the tips in over the right atrium for and right ventricle. There is a small left pleural effusion with atelectasis of left lung base. Superimposed pneumonia in the left lung base is not excluded. There is mild interstitial edema. There is a small calcified granuloma in the right apex. The visualized skeletal structures are stable. IMPRESSION: Left pleural effusion with atelectasis of left lung base, superimposed pneumonia in the left lung base is not excluded. Mild interstitial edema. Electronically Signed   By: Sherian Rein M.D.   On: 11/22/2015 15:14   EKG - V- paced rhythm with occasional  Early beats. I personally reviewed this EKG  Tele- ventricular paced rhythm IMPRESSION: 1. Aortic stenosis-status post bioprosthetic aortic valve replacement by Dr. Nevada Crane at [facility] in December. He had normal coronary arteries by cardiac catheterization prior to this. 2. Permanent chest his pacemaker-he had a pacemaker placed every fourth [facility] several days after aortic valve replacement.  3. Palpitations-he is a 21 mg Decadron patch and had several glasses of tea. He did have what appeared to be some PVCs but otherwise a paced rhythm. His letter lights are normal. There were no other arrhythmias noted. 4. Hypertension-under good control currently   RECOMMENDATION: 1. At this point, his palpitations seem to have resolved. I see no arrhythmias on telemetry. He is asymptomatic. His laboratory exam, chest x-ray and EKG showed  no acute findings. I'm going to send him home. He has an appointment to see his pulmonologist, Dr. Shelle Iron , at the [facility] next week.  Time Spent Directly with Patient: 30 minutes  Nanetta Batty 11/22/2015, 5:32 PM

## 2015-11-22 NOTE — ED Provider Notes (Signed)
CSN: [id number]     Arrival date & time 11/22/15  1411 History   First MD Initiated Contact with Patient 11/22/15 1521     Chief Complaint  Patient presents with  . Irregular Heart Beat     (Consider location/radiation/quality/duration/timing/severity/associated sxs/prior Treatment) Patient is a 70 y.o. male presenting with palpitations. The history is provided by the patient. No language interpreter was used.  Palpitations Palpitations quality:  Irregular Onset quality:  Sudden Duration:  30 minutes Timing:  Constant Progression:  Worsening Chronicity:  New Context: not blood loss   Relieved by:  None tried Worsened by:  Nothing Ineffective treatments:  None tried Associated symptoms: no chest pain, no chest pressure, no nausea and no vomiting   Risk factors: hx of atrial fibrillation     Past Medical History  Diagnosis Date  . Hypertension   . GERD (gastroesophageal reflux disease)   . Heart murmur    Past Surgical History  Procedure Laterality Date  . Appendectomy    . Hernia repair Bilateral     inguinal  . Tonsillectomy    . Pacemaker insertion    . Coronary artery bypass graft     Family History  Problem Relation Age of Onset  . Cancer Mother     Brain  . Coronary artery disease Father   . Lung disease Neg Hx    Social History  Substance Use Topics  . Smoking status: Current Some Day Smoker  . Smokeless tobacco: Never Used     Comment: Smoked 40 years @ peak rate of 1ppd  . Alcohol Use: 1.2 oz/week    2 Standard drinks or equivalent per week     Comment: 2 cans of beer daily    Review of Systems  Cardiovascular: Positive for palpitations. Negative for chest pain.  Gastrointestinal: Negative for nausea and vomiting.  All other systems reviewed and are negative.     Allergies  Review of patient's allergies indicates no known allergies.  Home Medications   Prior to Admission medications   Medication Sig Start Date End Date Taking? Authorizing  Provider  albuterol (VENTOLIN HFA) 108 (90 BASE) MCG/ACT inhaler Inhale 1-2 puffs into the lungs every 4 (four) hours as needed for wheezing or shortness of breath. 03/20/15  Yes Dorothea Ogle, MD  amiodarone (PACERONE) 200 MG tablet Take 200 mg by mouth daily.   Yes Historical Provider, MD  aspirin EC 81 MG tablet Take 81 mg by mouth daily.   Yes Historical Provider, MD  atorvastatin (LIPITOR) 40 MG tablet Take 40 mg by mouth daily.   Yes Historical Provider, MD  budesonide-formoterol (SYMBICORT) 80-4.5 MCG/ACT inhaler Inhale 2 puffs into the lungs 2 (two) times daily. 04/06/15  Yes Roslynn Amble, MD  ferrous sulfate 325 (65 FE) MG tablet Take 325 mg by mouth daily with breakfast.   Yes Historical Provider, MD  gabapentin (NEURONTIN) 300 MG capsule Take 300 mg by mouth at bedtime.   Yes Historical Provider, MD  guaiFENesin (MUCINEX) 600 MG 12 hr tablet Take 1,200 mg by mouth once. Reported on 10/15/2015   Yes Historical Provider, MD  LACTOBACILLUS PO Take by mouth.   Yes Historical Provider, MD  levalbuterol (XOPENEX) 1.25 MG/0.5ML nebulizer solution Take 1.25 mg by nebulization every 3 (three) hours as needed for wheezing or shortness of breath. 03/20/15  Yes Dorothea Ogle, MD  lisinopril-hydrochlorothiazide (PRINZIDE,ZESTORETIC) 10-12.5 MG per tablet Take 1 tablet by mouth daily. 03/20/15  Yes Dorothea Ogle, MD  nicotine (  NICODERM CQ - DOSED IN MG/24 HOURS) 21 mg/24hr patch Place 21 mg onto the skin daily. Pt has decreased to  14 mg patch  daily   Yes Historical Provider, MD  oxymetazoline (AFRIN NASAL SPRAY) 0.05 % nasal spray Place 1 spray into both nostrils 2 (two) times daily as needed for congestion. Reported on 10/15/2015   Yes Historical Provider, MD  pantoprazole (PROTONIX) 40 MG tablet Take 40 mg by mouth daily.   Yes Historical Provider, MD  PARoxetine (PAXIL) 20 MG tablet Take 20 mg by mouth daily.   Yes Historical Provider, MD  ranitidine (ZANTAC) 150 MG tablet Take 1 tablet (150 mg total)  by mouth at bedtime. 04/06/15 04/05/16 Yes Roslynn AmbleJennings E Nestor, MD  senna-docusate (SENOKOT-S) 8.6-50 MG tablet Take 2 tablets by mouth daily.   Yes Historical Provider, MD  Tiotropium Bromide-Olodaterol (STIOLTO RESPIMAT) 2.5-2.5 MCG/ACT AERS Inhale 2 puffs into the lungs 2 (two) times daily.   Yes Historical Provider, MD  zolpidem (AMBIEN) 10 MG tablet Take 1 tablet (10 mg total) by mouth at bedtime as needed for sleep. 03/20/15  Yes Dorothea OgleIskra M Myers, MD  esomeprazole (NEXIUM) 40 MG capsule Take 1 capsule (40 mg total) by mouth daily. Patient not taking: Reported on 10/11/2015 04/06/15 04/05/16  Roslynn AmbleJennings E Nestor, MD  guaiFENesin-dextromethorphan [facility](ROBITUSSIN DM) 100-10 MG/5ML syrup Take 5 mLs by mouth every 4 (four) hours as needed for cough. Patient not taking: Reported on 10/11/2015 03/20/15   Dorothea OgleIskra M Myers, MD  pantoprazole (PROTONIX) 40 MG tablet Take 40 mg by mouth daily. Reported on 10/15/2015    Historical Provider, MD  warfarin (COUMADIN) 5 MG tablet Take 5 mg by mouth daily. Reported on 10/29/2015    Historical Provider, MD   BP 122/56 mmHg  Pulse 81  Temp(Src) 98.2 F (36.8 C) (Oral)  Resp 18  SpO2 98% Physical Exam  Constitutional: He appears well-developed and well-nourished.  HENT:  Head: Normocephalic.  Eyes: Conjunctivae are normal. Pupils are equal, round, and reactive to light.  Neck: Normal range of motion. Neck supple.  Cardiovascular: Normal rate and regular rhythm.   Pulmonary/Chest: Effort normal and breath sounds normal.  Abdominal: Soft.  Musculoskeletal: Normal range of motion.  Neurological: He is alert.  Skin: Skin is warm.  Psychiatric: He has a normal mood and affect.  Nursing note and vitals reviewed.   ED Course  Procedures (including critical care time) Labs Review Labs Reviewed  BASIC METABOLIC PANEL - Abnormal; Notable for the following:    Glucose, Bld 123 (*)    All other components within normal limits  CBC - Abnormal; Notable for the following:    RDW  15.7 (*)    All other components within normal limits  I-STAT TROPOININ, ED    Imaging Review Dg Chest 2 View  11/22/2015  CLINICAL DATA:  1 hour intermittent heart fluttering. EXAM: CHEST  2 VIEW COMPARISON:  March 18, 2015 FINDINGS: The heart size and mediastinal contours are stable. Patient status post prior CABG and median sternotomy. Cardiac pacemaker is identified with the tips in over the right atrium for and right ventricle. There is a small left pleural effusion with atelectasis of left lung base. Superimposed pneumonia in the left lung base is not excluded. There is mild interstitial edema. There is a small calcified granuloma in the right apex. The visualized skeletal structures are stable. IMPRESSION: Left pleural effusion with atelectasis of left lung base, superimposed pneumonia in the left lung base is not excluded. Mild interstitial  edema. Electronically Signed   By: Sherian Rein M.D.   On: 11/22/2015 15:14   I have personally reviewed and evaluated these images and lab results as part of my medical decision-making.   EKG Interpretation   Date/Time:  Thursday November 22 2015 14:25:03 EDT Ventricular Rate:  82 PR Interval:    QRS Duration: 184 QT Interval:  452 QTC Calculation: 528 R Axis:   61 Text Interpretation:  Ventricular-paced rhythm Abnormal ECG Confirmed by  ZAMMIT  MD, Jomarie Longs [phone]) on 11/22/2015 3:22:06 PM      MDM Dr. Allyson Sabal Cardiology evaluated pt.   He feels patient is stable to go home.     Final diagnoses:  Palpitation    Pt advised to follow up with his MD next week as scheduled    Elson Areas, PA-C 11/22/15 1808  Geoffery Lyons, MD 11/22/15 1914  Geoffery Lyons, MD 11/22/15 2345

## 2015-11-23 ENCOUNTER — Encounter (HOSPITAL_COMMUNITY)
Admission: RE | Admit: 2015-11-23 | Discharge: 2015-11-23 | Disposition: A | Discharge status: Home / Self Care | Payer: No Typology Code available for payment source | Source: Ambulatory Visit | Attending: Cardiology | Admitting: Cardiology

## 2015-11-23 DIAGNOSIS — Z952 Presence of prosthetic heart valve: Secondary | ICD-10-CM | POA: Diagnosis not present

## 2015-11-23 NOTE — Progress Notes (Signed)
Pt returned to cardiac rehab today after recent ED visit for palpitations. Pt tolerated exercise without difficulty, asymptomatic.  No arrhythmia noted on telemetry during rehab.  Will continue to monitor.

## 2015-11-26 ENCOUNTER — Encounter (HOSPITAL_COMMUNITY)
Admission: RE | Admit: 2015-11-26 | Discharge: 2015-11-26 | Disposition: A | Discharge status: Home / Self Care | Payer: No Typology Code available for payment source | Source: Ambulatory Visit | Attending: Cardiology | Admitting: Cardiology

## 2015-11-26 DIAGNOSIS — Z952 Presence of prosthetic heart valve: Secondary | ICD-10-CM | POA: Diagnosis not present

## 2015-11-28 ENCOUNTER — Encounter (HOSPITAL_COMMUNITY)
Admission: RE | Admit: 2015-11-28 | Discharge: 2015-11-28 | Disposition: A | Discharge status: Home / Self Care | Payer: No Typology Code available for payment source | Source: Ambulatory Visit | Attending: Cardiology | Admitting: Cardiology

## 2015-11-28 DIAGNOSIS — Z952 Presence of prosthetic heart valve: Secondary | ICD-10-CM | POA: Diagnosis not present

## 2015-11-28 NOTE — Progress Notes (Signed)
Joseph Nicholson 70 y.o. male Nutrition Note Spoke with pt and pt's girlfriend. Nutrition Survey reviewed with pt. Pt is following Step 1 of the Therapeutic Lifestyle Changes diet. Pt and pt's girlfriend have changed pt's diet to help promote wt loss. Pt wants to lose wt to a goal of 225-230 lb. Pt is pre-diabetic according to A1c. Prediabetes discussed. Per discussion, Coumadin has been discontinued. Pt expressed understanding of the information reviewed. Pt aware of nutrition education classes offered. Lab Results  Component Value Date   HGBA1C 5.8* 03/18/2015   Wt Readings from Last 3 Encounters:  06/13/15 245 lb (111.131 kg)  04/06/15 248 lb (112.492 kg)  03/18/15 253 lb 12.8 oz (115.123 kg)   Nutrition Diagnosis ? Food-and nutrition-related knowledge deficit related to lack of exposure to information as related to diagnosis of: ? CVD ? Pre-DM ? Obesity related to excessive energy intake as evidenced by a BMI of 31.8 Nutrition Intervention ? Benefits of adopting Therapeutic Lifestyle Changes discussed when Medficts reviewed. ? Pt to attend the Portion Distortion class - met; 11/16/15 ? Pt to attend the  ? Nutrition I class                     ? Nutrition II class ? Pt given handouts for: ? 5-day, 1800 kcal menu ideas ? Pre-diabetes ? Continue client-centered nutrition education by RD, as part of interdisciplinary care.  Goal(s) ? Pt to identify and limit food sources of saturated fat, trans fat, and sodium ? Pt to identify food quantities necessary to achieve weight loss of 6-24 lb (2.7-10.9 kg) at graduation from cardiac rehab.   Monitor and Evaluate progress toward nutrition goal with team.  Derek Mound, M.Ed, RD, LDN, CDE 11/28/2015 3:16 PM

## 2015-11-30 ENCOUNTER — Encounter (HOSPITAL_COMMUNITY)
Admission: RE | Admit: 2015-11-30 | Discharge: 2015-11-30 | Disposition: A | Discharge status: Home / Self Care | Payer: No Typology Code available for payment source | Source: Ambulatory Visit | Attending: Cardiology | Admitting: Cardiology

## 2015-11-30 DIAGNOSIS — Z952 Presence of prosthetic heart valve: Secondary | ICD-10-CM | POA: Diagnosis not present

## 2015-12-03 ENCOUNTER — Encounter (HOSPITAL_COMMUNITY)
Admission: RE | Admit: 2015-12-03 | Discharge: 2015-12-03 | Disposition: A | Discharge status: Home / Self Care | Payer: No Typology Code available for payment source | Source: Ambulatory Visit | Attending: Cardiology | Admitting: Cardiology

## 2015-12-03 DIAGNOSIS — Z952 Presence of prosthetic heart valve: Secondary | ICD-10-CM | POA: Diagnosis not present

## 2015-12-05 ENCOUNTER — Encounter (HOSPITAL_COMMUNITY): Payer: No Typology Code available for payment source

## 2015-12-07 ENCOUNTER — Encounter (HOSPITAL_COMMUNITY)
Admission: RE | Admit: 2015-12-07 | Discharge: 2015-12-07 | Disposition: A | Discharge status: Home / Self Care | Payer: No Typology Code available for payment source | Source: Ambulatory Visit | Attending: Cardiology | Admitting: Cardiology

## 2015-12-07 DIAGNOSIS — Z952 Presence of prosthetic heart valve: Secondary | ICD-10-CM | POA: Diagnosis not present

## 2015-12-07 NOTE — Progress Notes (Addendum)
Pt c/o dizziness post exercise at cardiac rehab after bending over to pick up handweights. Symptoms resolved with sitting still. BP:  120/70 sitting, 130/70 standing. Telemetry- V paced, rate 104.  Pt denies pain or dyspnea. Pt given gatorade and feet propped up for comfort. Pt encouraged to change positions carefully.  Pt also reports he was started on new nebulized medication yesterday however he is unsure of the name. Pt was instructed to call department to report medication name and dosage. Pt verbalized understanding.   Addendum:  Pt called to report medication is glutathion-L plus prescribed by [facility] [place].  Pt advised this is supplement which means has not been tested by FDA, therefore side effects unknown. Pt verbalized understanding. Medication list reconciled.

## 2015-12-10 ENCOUNTER — Encounter (HOSPITAL_COMMUNITY)
Admission: RE | Admit: 2015-12-10 | Discharge: 2015-12-10 | Disposition: A | Discharge status: Home / Self Care | Payer: No Typology Code available for payment source | Source: Ambulatory Visit | Attending: Cardiology | Admitting: Cardiology

## 2015-12-10 DIAGNOSIS — Z952 Presence of prosthetic heart valve: Secondary | ICD-10-CM | POA: Diagnosis not present

## 2015-12-10 DIAGNOSIS — Z950 Presence of cardiac pacemaker: Secondary | ICD-10-CM | POA: Insufficient documentation

## 2015-12-12 ENCOUNTER — Encounter (HOSPITAL_COMMUNITY)
Admission: RE | Admit: 2015-12-12 | Discharge: 2015-12-12 | Disposition: A | Discharge status: Home / Self Care | Payer: No Typology Code available for payment source | Source: Ambulatory Visit | Attending: Cardiology | Admitting: Cardiology

## 2015-12-12 DIAGNOSIS — Z952 Presence of prosthetic heart valve: Secondary | ICD-10-CM | POA: Diagnosis not present

## 2015-12-14 ENCOUNTER — Encounter (HOSPITAL_COMMUNITY): Payer: No Typology Code available for payment source

## 2015-12-17 ENCOUNTER — Encounter (HOSPITAL_COMMUNITY): Payer: No Typology Code available for payment source

## 2015-12-19 ENCOUNTER — Encounter (HOSPITAL_COMMUNITY): Admission: RE | Admit: 2015-12-19 | Payer: No Typology Code available for payment source | Source: Ambulatory Visit

## 2015-12-21 ENCOUNTER — Encounter (HOSPITAL_COMMUNITY): Payer: No Typology Code available for payment source

## 2015-12-24 ENCOUNTER — Encounter (HOSPITAL_COMMUNITY)
Admission: RE | Admit: 2015-12-24 | Discharge: 2015-12-24 | Disposition: A | Discharge status: Home / Self Care | Payer: No Typology Code available for payment source | Source: Ambulatory Visit | Attending: Cardiology | Admitting: Cardiology

## 2015-12-24 DIAGNOSIS — Z952 Presence of prosthetic heart valve: Secondary | ICD-10-CM | POA: Diagnosis not present

## 2015-12-26 ENCOUNTER — Encounter (HOSPITAL_COMMUNITY)
Admission: RE | Admit: 2015-12-26 | Discharge: 2015-12-26 | Disposition: A | Discharge status: Home / Self Care | Payer: No Typology Code available for payment source | Source: Ambulatory Visit | Attending: Cardiology | Admitting: Cardiology

## 2015-12-26 DIAGNOSIS — Z952 Presence of prosthetic heart valve: Secondary | ICD-10-CM | POA: Diagnosis not present

## 2015-12-28 ENCOUNTER — Encounter (HOSPITAL_COMMUNITY)
Admission: RE | Admit: 2015-12-28 | Discharge: 2015-12-28 | Disposition: A | Discharge status: Home / Self Care | Payer: No Typology Code available for payment source | Source: Ambulatory Visit | Attending: Cardiology | Admitting: Cardiology

## 2015-12-28 DIAGNOSIS — Z952 Presence of prosthetic heart valve: Secondary | ICD-10-CM | POA: Diagnosis not present

## 2015-12-31 ENCOUNTER — Encounter (HOSPITAL_COMMUNITY)
Admission: RE | Admit: 2015-12-31 | Discharge: 2015-12-31 | Disposition: A | Discharge status: Home / Self Care | Payer: No Typology Code available for payment source | Source: Ambulatory Visit | Attending: Cardiology | Admitting: Cardiology

## 2015-12-31 DIAGNOSIS — Z952 Presence of prosthetic heart valve: Secondary | ICD-10-CM | POA: Diagnosis not present

## 2016-01-02 ENCOUNTER — Encounter (HOSPITAL_COMMUNITY)
Admission: RE | Admit: 2016-01-02 | Discharge: 2016-01-02 | Disposition: A | Discharge status: Home / Self Care | Payer: No Typology Code available for payment source | Source: Ambulatory Visit | Attending: Cardiology | Admitting: Cardiology

## 2016-01-02 DIAGNOSIS — Z952 Presence of prosthetic heart valve: Secondary | ICD-10-CM | POA: Diagnosis not present

## 2016-01-04 ENCOUNTER — Encounter (HOSPITAL_COMMUNITY)
Admission: RE | Admit: 2016-01-04 | Discharge: 2016-01-04 | Disposition: A | Discharge status: Home / Self Care | Payer: No Typology Code available for payment source | Source: Ambulatory Visit | Attending: Cardiology | Admitting: Cardiology

## 2016-01-04 DIAGNOSIS — Z952 Presence of prosthetic heart valve: Secondary | ICD-10-CM | POA: Diagnosis not present

## 2016-01-04 NOTE — Progress Notes (Signed)
Pt arrived at cardiac rehab c/o fatigue and anxiety. 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 Pt also reports he has not had adequate PO fluid intake. BP:  124/70, HR- 99.  Pt given gatorade.   Pt tolerated lighter than usual exercise without difficulty.

## 2016-01-07 ENCOUNTER — Encounter (HOSPITAL_COMMUNITY)
Admission: RE | Admit: 2016-01-07 | Discharge: 2016-01-07 | Disposition: A | Discharge status: Home / Self Care | Payer: No Typology Code available for payment source | Source: Ambulatory Visit | Attending: Cardiology | Admitting: Cardiology

## 2016-01-07 DIAGNOSIS — Z950 Presence of cardiac pacemaker: Secondary | ICD-10-CM | POA: Diagnosis not present

## 2016-01-07 DIAGNOSIS — Z952 Presence of prosthetic heart valve: Secondary | ICD-10-CM | POA: Diagnosis not present

## 2016-01-09 ENCOUNTER — Encounter (HOSPITAL_COMMUNITY)
Admission: RE | Admit: 2016-01-09 | Discharge: 2016-01-09 | Disposition: A | Discharge status: Home / Self Care | Payer: No Typology Code available for payment source | Source: Ambulatory Visit | Attending: Cardiology | Admitting: Cardiology

## 2016-01-09 DIAGNOSIS — Z952 Presence of prosthetic heart valve: Secondary | ICD-10-CM | POA: Diagnosis not present

## 2016-01-11 ENCOUNTER — Encounter (HOSPITAL_COMMUNITY)
Admission: RE | Admit: 2016-01-11 | Discharge: 2016-01-11 | Disposition: A | Discharge status: Home / Self Care | Payer: No Typology Code available for payment source | Source: Ambulatory Visit | Attending: Cardiology | Admitting: Cardiology

## 2016-01-11 DIAGNOSIS — Z952 Presence of prosthetic heart valve: Secondary | ICD-10-CM | POA: Diagnosis not present

## 2016-01-14 ENCOUNTER — Emergency Department (HOSPITAL_COMMUNITY)
Admission: EM | Admit: 2016-01-14 | Discharge: 2016-01-15 | Disposition: A | Discharge status: Home / Self Care | Payer: No Typology Code available for payment source | Attending: Emergency Medicine | Admitting: Emergency Medicine

## 2016-01-14 ENCOUNTER — Encounter (HOSPITAL_COMMUNITY)
Admission: RE | Admit: 2016-01-14 | Discharge: 2016-01-14 | Disposition: A | Discharge status: Home / Self Care | Payer: No Typology Code available for payment source | Source: Ambulatory Visit | Attending: Cardiology | Admitting: Cardiology

## 2016-01-14 ENCOUNTER — Encounter (HOSPITAL_COMMUNITY): Payer: Self-pay | Admitting: *Deleted

## 2016-01-14 DIAGNOSIS — Z79899 Other long term (current) drug therapy: Secondary | ICD-10-CM | POA: Insufficient documentation

## 2016-01-14 DIAGNOSIS — Z7951 Long term (current) use of inhaled steroids: Secondary | ICD-10-CM | POA: Diagnosis not present

## 2016-01-14 DIAGNOSIS — R011 Cardiac murmur, unspecified: Secondary | ICD-10-CM | POA: Insufficient documentation

## 2016-01-14 DIAGNOSIS — R42 Dizziness and giddiness: Secondary | ICD-10-CM | POA: Diagnosis present

## 2016-01-14 DIAGNOSIS — Z952 Presence of prosthetic heart valve: Secondary | ICD-10-CM | POA: Diagnosis not present

## 2016-01-14 DIAGNOSIS — K219 Gastro-esophageal reflux disease without esophagitis: Secondary | ICD-10-CM | POA: Insufficient documentation

## 2016-01-14 DIAGNOSIS — I10 Essential (primary) hypertension: Secondary | ICD-10-CM | POA: Insufficient documentation

## 2016-01-14 DIAGNOSIS — H538 Other visual disturbances: Secondary | ICD-10-CM | POA: Insufficient documentation

## 2016-01-14 DIAGNOSIS — F17210 Nicotine dependence, cigarettes, uncomplicated: Secondary | ICD-10-CM | POA: Insufficient documentation

## 2016-01-14 DIAGNOSIS — Z7982 Long term (current) use of aspirin: Secondary | ICD-10-CM | POA: Diagnosis not present

## 2016-01-14 LAB — URINALYSIS, ROUTINE W REFLEX MICROSCOPIC
BILIRUBIN URINE: NEGATIVE
GLUCOSE, UA: NEGATIVE mg/dL
HGB URINE DIPSTICK: NEGATIVE
Ketones, ur: NEGATIVE mg/dL
Leukocytes, UA: NEGATIVE
Nitrite: NEGATIVE
PH: 6 (ref 5.0–8.0)
Protein, ur: NEGATIVE mg/dL
Specific Gravity, Urine: 1.02 (ref 1.005–1.030)

## 2016-01-14 LAB — BASIC METABOLIC PANEL
Anion gap: 10 (ref 5–15)
BUN: 20 mg/dL (ref 6–20)
CHLORIDE: 103 mmol/L (ref 101–111)
CO2: 25 mmol/L (ref 22–32)
Calcium: 9.8 mg/dL (ref 8.9–10.3)
Creatinine, Ser: 1.21 mg/dL (ref 0.61–1.24)
GFR calc Af Amer: >60 mL/min (ref >60)
GFR calc non Af Amer: 59 mL/min — ABNORMAL LOW (ref >60)
GLUCOSE: 102 mg/dL — AB (ref 65–99)
POTASSIUM: 4.2 mmol/L (ref 3.5–5.1)
Sodium: 138 mmol/L (ref 135–145)

## 2016-01-14 LAB — CBC
HEMATOCRIT: 41.4 % (ref 39.0–52.0)
Hemoglobin: 13.8 g/dL (ref 13.0–17.0)
MCH: 27.9 pg (ref 26.0–34.0)
MCHC: 33.3 g/dL (ref 30.0–36.0)
MCV: 83.6 fL (ref 78.0–100.0)
Platelets: 294 10*3/uL (ref 150–400)
RBC: 4.95 MIL/uL (ref 4.22–5.81)
RDW: 16.3 % — AB (ref 11.5–15.5)
WBC: 8.8 10*3/uL (ref 4.0–10.5)

## 2016-01-14 LAB — I-STAT TROPONIN, ED: TROPONIN I, POC: 0 ng/mL (ref 0.00–0.08)

## 2016-01-14 LAB — CBG MONITORING, ED: Glucose-Capillary: 87 mg/dL (ref 65–99)

## 2016-01-14 MED ORDER — ALBUTEROL SULFATE (2.5 MG/3ML) 0.083% IN NEBU
5.0000 mg | INHALATION_SOLUTION | Freq: Once | RESPIRATORY_TRACT | Status: AC
  Administered 2016-01-14: 5 mg via RESPIRATORY_TRACT
  Filled 2016-01-14: qty 6

## 2016-01-14 NOTE — ED Notes (Signed)
Pt was in cardiac rehab. Reports having episodes of dizziness that were relieved with rest. Had one episode of vision changes to left eye when leaving rehab, describes as "black strip that interfered with vision field." the vision changes were relieved after approx 1 min. Denies dizziness or any other complaints at triage.

## 2016-01-14 NOTE — Progress Notes (Signed)
Pt c/o dizziness post exercise at cardiac rehab. Pt had to sit down during cool down stretches due to dizziness which relieved with rest.  However dizziness returned as pt was leaving the department, this time accompanied with visual changes in left eye.  Pt described as "black sheet covering my eye".  BP:  120/60 HR- ventricular paced rhythm. Pt cardiologist, Dr. Senaida O[place]ichardson, [facility] made aware.  Pt escorted to emergency department via wheelchair for further evaluation.

## 2016-01-14 NOTE — ED Provider Notes (Signed)
CSN: [id number]     Arrival date & time 01/14/16  1451 History   First MD Initiated Contact with Patient 01/14/16 2138     Chief Complaint  Patient presents with  . Visual Field Change  . Dizziness     (Consider location/radiation/quality/duration/timing/severity/associated sxs/prior Treatment) HPI   This is a 70 year old male who presents emergency Department with chief complaint of presyncope and visual field change. Patient was at cardiac rehabilitation earlier today. He bent down to place his hand weights under his chair and when he stood up he felt very dizzy. He states that during the period of dizziness. He had a change in the left eye visual field. He describes it as a black cloud living over his eye. He states he is only able to see the upper portion of his nurses head. He states that it lasted approximately 40 sec. The patient has a past medical history of recent CABG, valve replacement, and pacemaker insertion. He is a current some day smoker. He states that he is feeling much better and denies any neurologic deficit since his current visual disturbance, headache, facial droop, unilateral weakness, vertigo or dystaxia. Past Medical History  Diagnosis Date  . Hypertension   . GERD (gastroesophageal reflux disease)   . Heart murmur    Past Surgical History  Procedure Laterality Date  . Appendectomy    . Hernia repair Bilateral     inguinal  . Tonsillectomy    . Pacemaker insertion    . Coronary artery bypass graft     Family History  Problem Relation Age of Onset  . Cancer Mother     Brain  . Coronary artery disease Father   . Lung disease Neg Hx    Social History  Substance Use Topics  . Smoking status: Current Some Day Smoker  . Smokeless tobacco: Never Used     Comment: Smoked 40 years @ peak rate of 1ppd  . Alcohol Use: 1.2 oz/week    2 Standard drinks or equivalent per week     Comment: 2 cans of beer daily    Review of Systems  Ten systems reviewed and  are negative for acute change, except as noted in the HPI.   Allergies  Review of patient's allergies indicates no known allergies.  Home Medications   Prior to Admission medications   Medication Sig Start Date End Date Taking? Authorizing Provider  albuterol (VENTOLIN HFA) 108 (90 BASE) MCG/ACT inhaler Inhale 1-2 puffs into the lungs every 4 (four) hours as needed for wheezing or shortness of breath. 03/20/15   Dorothea Ogle, MD  amiodarone (PACERONE) 200 MG tablet Take 200 mg by mouth daily.    Historical Provider, MD  aspirin EC 81 MG tablet Take 81 mg by mouth daily.    Historical Provider, MD  atorvastatin (LIPITOR) 40 MG tablet Take 40 mg by mouth daily.    Historical Provider, MD  budesonide-formoterol (SYMBICORT) 80-4.5 MCG/ACT inhaler Inhale 2 puffs into the lungs 2 (two) times daily. 04/06/15   Roslynn Amble, MD  esomeprazole (NEXIUM) 40 MG capsule Take 1 capsule (40 mg total) by mouth daily. 04/06/15 04/05/16  Roslynn Amble, MD  ferrous sulfate 325 (65 FE) MG tablet Take 325 mg by mouth daily with breakfast.    Historical Provider, MD  gabapentin (NEURONTIN) 300 MG capsule Take 300 mg by mouth at bedtime.    Historical Provider, MD  Glutathione-L POWD Inhale 1 capsule into the lungs daily.    Historical  Provider, MD  guaiFENesin (MUCINEX) 600 MG 12 hr tablet Take 1,200 mg by mouth once. Reported on 10/15/2015    Historical Provider, MD  guaiFENesin-dextromethorphan (ROBITUSSIN DM) 100-10 MG/5ML syrup Take 5 mLs by mouth every 4 (four) hours as needed for cough. 03/20/15   Dorothea OgleIskra M Myers, MD  LACTOBACILLUS PO Take by mouth.    Historical Provider, MD  levalbuterol (XOPENEX) 1.25 MG/0.5ML nebulizer solution Take 1.25 mg by nebulization every 3 (three) hours as needed for wheezing or shortness of breath. 03/20/15   Dorothea OgleIskra M Myers, MD  lisinopril-hydrochlorothiazide (PRINZIDE,ZESTORETIC) 10-12.5 MG per tablet Take 1 tablet by mouth daily. 03/20/15   Dorothea OgleIskra M Myers, MD  nicotine (NICODERM CQ  - DOSED IN MG/24 HOURS) 21 mg/24hr patch Place 21 mg onto the skin daily. Pt has decreased to  14 mg patch  daily    Historical Provider, MD  oxymetazoline (AFRIN NASAL SPRAY) 0.05 % nasal spray Place 1 spray into both nostrils 2 (two) times daily as needed for congestion. Reported on 10/15/2015    Historical Provider, MD  pantoprazole (PROTONIX) 40 MG tablet Take 40 mg by mouth daily. Reported on 10/15/2015    Historical Provider, MD  pantoprazole (PROTONIX) 40 MG tablet Take 40 mg by mouth daily.    Historical Provider, MD  PARoxetine (PAXIL) 20 MG tablet Take 20 mg by mouth daily.    Historical Provider, MD  ranitidine (ZANTAC) 150 MG tablet Take 1 tablet (150 mg total) by mouth at bedtime. 04/06/15 04/05/16  Roslynn AmbleJennings E Nestor, MD  senna-docusate (SENOKOT-S) 8.6-50 MG tablet Take 2 tablets by mouth daily.    Historical Provider, MD  Tiotropium Bromide-Olodaterol (STIOLTO RESPIMAT) 2.5-2.5 MCG/ACT AERS Inhale 2 puffs into the lungs 2 (two) times daily.    Historical Provider, MD  warfarin (COUMADIN) 5 MG tablet Take 5 mg by mouth daily. Reported on 10/29/2015    Historical Provider, MD  zolpidem (AMBIEN) 10 MG tablet Take 1 tablet (10 mg total) by mouth at bedtime as needed for sleep. 03/20/15   Dorothea OgleIskra M Myers, MD   BP 112/74 mmHg  Pulse 73  Temp(Src) 98.4 F (36.9 C) (Oral)  Resp 20  SpO2 97% Physical Exam  Constitutional: He appears well-developed and well-nourished. No distress.  HENT:  Head: Normocephalic and atraumatic.  Eyes: Conjunctivae are normal. No scleral icterus.  Neck: Normal range of motion. Neck supple.  Cardiovascular: Normal rate, regular rhythm and normal heart sounds.   Pulmonary/Chest: Effort normal and breath sounds normal. No respiratory distress.  Cough , mild wheeze  Abdominal: Soft. There is no tenderness.  Musculoskeletal: He exhibits no edema.  Neurological: He is alert.  Skin: Skin is warm and dry. He is not diaphoretic.  Psychiatric: His behavior is normal.   Nursing note and vitals reviewed.   ED Course  Procedures (including critical care time) Labs Review Labs Reviewed  BASIC METABOLIC PANEL - Abnormal; Notable for the following:    Glucose, Bld 102 (*)    GFR calc non Af Amer 59 (*)    All other components within normal limits  CBC - Abnormal; Notable for the following:    RDW 16.3 (*)    All other components within normal limits  URINALYSIS, ROUTINE W REFLEX MICROSCOPIC (NOT AT [facility])  CBG MONITORING, ED  I-STAT TROPOININ, ED    Imaging Review No results found. I have personally reviewed and evaluated these images and lab results as part of my medical decision-making.   EKG Interpretation None  MDM   Final diagnoses:  None    Patient with known vascular disease, episode of dizziness and amaurosis fugax. I have discussed the case with Dr. Roseanne Reno who is on call for neurology. He recommends CT Angio. Head and neck. I have given report to Dr. Mora Bellman, who will assume care of patient.    Arthor Captain, PA-C 01/15/16 0112  Richardean Canal, MD 01/15/16 [phone]

## 2016-01-15 ENCOUNTER — Emergency Department (HOSPITAL_COMMUNITY): Payer: No Typology Code available for payment source

## 2016-01-15 ENCOUNTER — Encounter (HOSPITAL_COMMUNITY): Payer: Self-pay | Admitting: Radiology

## 2016-01-15 LAB — I-STAT TROPONIN, ED: TROPONIN I, POC: 0 ng/mL (ref 0.00–0.08)

## 2016-01-15 MED ORDER — IOPAMIDOL (ISOVUE-370) INJECTION 76%
INTRAVENOUS | Status: AC
  Administered 2016-01-15: 50 mL
  Filled 2016-01-15: qty 50

## 2016-01-15 NOTE — ED Provider Notes (Signed)
Patient signed out as pending CT scan for evaluation of blurry vision. Results are below. As discussed with Dr. Roseanne RenoStewart who recommends patient take a baby aspirin daily and see his primary care physician and ophthalmologist for close follow-up. Patient is no longer having symptoms. He appears well in no acute distress, vital signs were within his normal limits and he is safe for discharge.    IMPRESSION: CTA NECK IMPRESSION:  1. No high-grade flow-limiting or critical stenosis identified within the neck. 2. Relatively mild atheromatous plaque about the carotid bifurcations/proximal ICAs without significant flow-limiting stenosis. Changes are slightly worse on the right were there is narrowing of up to 20% by NASCET criteria. 3. Focal plaque at the origin of the right vertebral artery with secondary mild to moderate stenosis. Dominant left vertebral artery is widely patent. 4. Large layering left pleural effusion, incompletely visualized.  CTA HEAD IMPRESSION:  1. No large or proximal arterial branch occlusion within the intracranial circulation. No high-grade or correctable stenosis. The ophthalmic arteries are patent proximally. 2. Mild scattered plaque within the petrous segments bilaterally as well as the cavernous left ICA without significant stenosis. 3. Calcified plaque within the left V4 segment with secondary mild focal stenosis. Mild atheromatous narrowing of the diminutive right V4 segment as well. 4. Mild atrophy with chronic small vessel ischemic disease.  Tomasita CrumbleAdeleke Johntavius Shepard, MD 01/15/16 [phone]

## 2016-01-15 NOTE — Discharge Instructions (Signed)
Blurred Vision Joseph Nicholson, your CT scan results are below and do not show any signs of stroke.  See your primary care doctor within 3 days for your dizziness.  Also see an ophthalmologist within 3 days for your blurry vision.  Come back to the ED immediately if any symptoms worsen. Thank you.   IMPRESSION: CTA NECK IMPRESSION:  1. No high-grade flow-limiting or critical stenosis identified within the neck. 2. Relatively mild atheromatous plaque about the carotid bifurcations/proximal ICAs without significant flow-limiting stenosis. Changes are slightly worse on the right were there is narrowing of up to 20% by NASCET criteria. 3. Focal plaque at the origin of the right vertebral artery with secondary mild to moderate stenosis. Dominant left vertebral artery is widely patent. 4. Large layering left pleural effusion, incompletely visualized.  CTA HEAD IMPRESSION:  1. No large or proximal arterial branch occlusion within the intracranial circulation. No high-grade or correctable stenosis. The ophthalmic arteries are patent proximally. 2. Mild scattered plaque within the petrous segments bilaterally as well as the cavernous left ICA without significant stenosis. 3. Calcified plaque within the left V4 segment with secondary mild focal stenosis. Mild atheromatous narrowing of the diminutive right V4 segment as well. 4. Mild atrophy with chronic small vessel ischemic disease.  Having blurred vision means that you cannot see things clearly. Your vision may seem fuzzy or out of focus. Blurred vision is a very common symptom of an eye or vision problem. Blurred vision is often a gradual blur that occurs in one eye or both eyes. There are many causes of blurred vision, including cataracts, macular degeneration, and diabetic retinopathy. Blurred vision can be diagnosed based on your symptoms and a physical exam. Tell your health care provider about any other health problems you have, any recent  eye injury, and any prior surgeries. You may need to see a health care provider who specializes in eye problems (ophthalmologist). Your treatment depends on what is causing your blurred vision.  HOME CARE INSTRUCTIONS  Tell your health care provider about any changes in your blurred vision.  Do not drive or operate heavy machinery if your vision is blurry.  Keep all follow-up visits as directed by your health care provider. This is important. SEEK MEDICAL CARE IF:  Your symptoms get worse.  You have new symptoms.  You have trouble seeing at night.  You have trouble seeing up close or far away.  You have trouble noticing the difference between colors. SEEK IMMEDIATE MEDICAL CARE IF:  You have severe eye pain.  You have a severe headache.  You have flashing lights in your field of vision.  You have a sudden change in vision.  You have a sudden loss of vision.  You have vision change after an injury.  You notice drainage coming from your eyes.  You notice a rash around your eyes.   This information is not intended to replace advice given to you by your health care provider. Make sure you discuss any questions you have with your health care provider.   Document Released: 08/28/2003 Document Revised: 01/09/2015 Document Reviewed: 07/19/2014 Elsevier Interactive Patient Education 2016 Else[employer] Inc. Dizziness Dizziness is a common problem. It makes you feel unsteady or lightheaded. You may feel like you are about to pass out (faint). Dizziness can lead to injury if you stumble or fall. Anyone can get dizzy, but dizziness is more common in older adults. This condition can be caused by a number of things, including:  Medicines.  Dehydration.  Illness. HOME CARE Following these instructions may help with your condition: Eating and Drinking  Drink enough fluid to keep your pee (urine) clear or pale yellow. This helps to keep you from getting dehydrated. Try to drink more  clear fluids, such as water.  Do not drink alcohol.  Limit how much caffeine you drink or eat if told by your doctor.  Limit how much salt you drink or eat if told by your doctor. Activity  Avoid making quick movements.  When you stand up from sitting in a chair, steady yourself until you feel okay.  In the morning, first sit up on the side of the bed. When you feel okay, stand slowly while you hold onto something. Do this until you know that your balance is fine.  Move your legs often if you need to stand in one place for a long time. Tighten and relax your muscles in your legs while you are standing.  Do not drive or use heavy machinery if you feel dizzy.  Avoid bending down if you feel dizzy. Place items in your home so that they are easy for you to reach without leaning over. Lifestyle  Do not use any tobacco products, including cigarettes, chewing tobacco, or electronic cigarettes. If you need help quitting, ask your doctor.  Try to lower your stress level, such as with yoga or meditation. Talk with your doctor if you need help. General Instructions  Watch your dizziness for any changes.  Take medicines only as told by your doctor. Talk with your doctor if you think that your dizziness is caused by a medicine that you are taking.  Tell a friend or a family member that you are feeling dizzy. If he or she notices any changes in your behavior, have this person call your doctor.  Keep all follow-up visits as told by your doctor. This is important. GET HELP IF:  Your dizziness does not go away.  Your dizziness or light-headedness gets worse.  You feel sick to your stomach (nauseous).  You have trouble hearing.  You have new symptoms.  You are unsteady on your feet or you feel like the room is spinning. GET HELP RIGHT AWAY IF:  You throw up (vomit) or have diarrhea and are unable to eat or drink anything.  You have  trouble:  Talking.  Walking.  Swallowing.  Using your arms, hands, or legs.  You feel generally weak.  You are not thinking clearly or you have trouble forming sentences. It may take a friend or family member to notice this.  You have:  Chest pain.  Pain in your belly (abdomen).  Shortness of breath.  Sweating.  Your vision changes.  You are bleeding.  You have a headache.  You have neck pain or a stiff neck.  You have a fever.   This information is not intended to replace advice given to you by your health care provider. Make sure you discuss any questions you have with your health care provider.   Document Released: 08/14/2011 Document Revised: 01/09/2015 Document Reviewed: 08/21/2014 Elsevier Interactive Patient Education [employer] Elsevier Inc.

## 2016-01-15 NOTE — ED Notes (Signed)
Dr. Oni at bedside at this time.  

## 2016-01-16 ENCOUNTER — Encounter (HOSPITAL_COMMUNITY)
Admission: RE | Admit: 2016-01-16 | Discharge: 2016-01-16 | Disposition: A | Discharge status: Home / Self Care | Payer: No Typology Code available for payment source | Source: Ambulatory Visit | Attending: Cardiology | Admitting: Cardiology

## 2016-01-16 DIAGNOSIS — Z952 Presence of prosthetic heart valve: Secondary | ICD-10-CM | POA: Diagnosis not present

## 2016-01-18 ENCOUNTER — Encounter (HOSPITAL_COMMUNITY)
Admission: RE | Admit: 2016-01-18 | Discharge: 2016-01-18 | Disposition: A | Discharge status: Home / Self Care | Payer: No Typology Code available for payment source | Source: Ambulatory Visit | Attending: Cardiology | Admitting: Cardiology

## 2016-01-18 ENCOUNTER — Encounter (HOSPITAL_COMMUNITY): Payer: Self-pay

## 2016-01-18 DIAGNOSIS — Z952 Presence of prosthetic heart valve: Secondary | ICD-10-CM | POA: Diagnosis not present

## 2016-01-18 NOTE — Progress Notes (Signed)
Pt graduated from cardiac rehab program today with completion of 36 exercise sessions in Phase II. Pt maintained good attendance and progressed nicely during his participation in rehab as evidenced by increased MET level.   Medication list reconciled. Repeat  PHQ score- 0, pt has very upbeat attitude with positive coping skills and hopeful outlook .  Pt has made significant lifestyle changes and should be commended for his success. Pt feels he has exceeded his goals during cardiac rehab by increasing his physical strength and stamina.  Pt doubled his walking distance ability.   Pt plans to continue exercising on his own at local gym.

## 2016-06-25 ENCOUNTER — Telehealth (HOSPITAL_COMMUNITY): Payer: Self-pay

## 2016-06-25 NOTE — Telephone Encounter (Signed)
-----   Message from Henrietta DineKathryn A Kemp, RN sent at 10/08/2015  4:54 PM EST ----- Regarding: RE: Medical coverage for patient I have not received this yet. I also wanted to remind you that Dr. Mayford Knifeurner is out of the office on medical leave through most of February. I'm trying my best to keep everything caught up!  Thanks for your understanding,  Orpha BurKaty  ----- Message -----    From: Paulita FujitaAmber D Neyla Gauntt    Sent: 10/08/2015   4:45 PM      To: Henrietta DineKathryn A Kemp, RN Subject: Medical coverage for patient                   I sent over an order requesting coverage for patient signed by Doctor Mayford Knifeurner. He is eager to get started with the exercise program. He is already signed up for orientation on Thursday Feb 2., 2017.   Thank you   Kerr-McGeember Kyvon Hu

## 2016-06-25 NOTE — Telephone Encounter (Signed)
-----   Message from Quintella Reichertraci R Turner, MD sent at 01/17/2016  2:20 PM EDT ----- Regarding: RE: weight lifting request [facility]  Traci ----- Message -----    From: Paulita FujitaAmber D Geovanna Simko    Sent: 01/17/2016   8:57 AM      To: Quintella Reichertraci R Turner, MD Subject: weight lifting request                         Patient has been in cardiac rehab approximately 3 months and doing is well, and will be graduating on Monday Jan 21, 2016. Pt has been averaging 3-4 METS and BP/HR has been in stabled with exercise. Pt is interested in doing strength training. If you feel this patient is appropriate to begin strength training please f/u with cardiac rehab staff.   Thanks  Kelly Servicesmber Nikyla Navedo,MS,ACSM RCEP

## 2016-06-25 NOTE — Telephone Encounter (Signed)
-----   Message from Quintella Reichertraci R Turner, MD sent at 11/19/2015 12:24 PM EDT ----- Regarding: RE: strength training request I would prefer to hold off for now  Armanda Magicraci Turner, MD ----- Message -----    From: Paulita FujitaAmber D Arihanna Estabrook    Sent: 11/19/2015   8:12 AM      To: Quintella Reichertraci R Turner, MD Subject: strength training request                      RE: Joseph Nicholson                                              DOB: 03/25/1946  S/P: AVR   Patient has been in rehab approximately 6 weeks and is doing well. f If you feel this patient is appropriate to begin strength training, please sign and return fax. We appreciate your assistance and referral of your patient to our program.   Thanks,  Kelly Servicesmber Braycen Burandt,MS,ACSM RCEP

## 2016-11-04 IMAGING — CR DG CHEST 2V
2 series · 2 of 2 positions shown · non-contrast
Comparison: None.

CLINICAL DATA: Shortness of breath, no chest pain.  Current smoker.

EXAM:
CHEST  2 VIEW

[w chest pa]
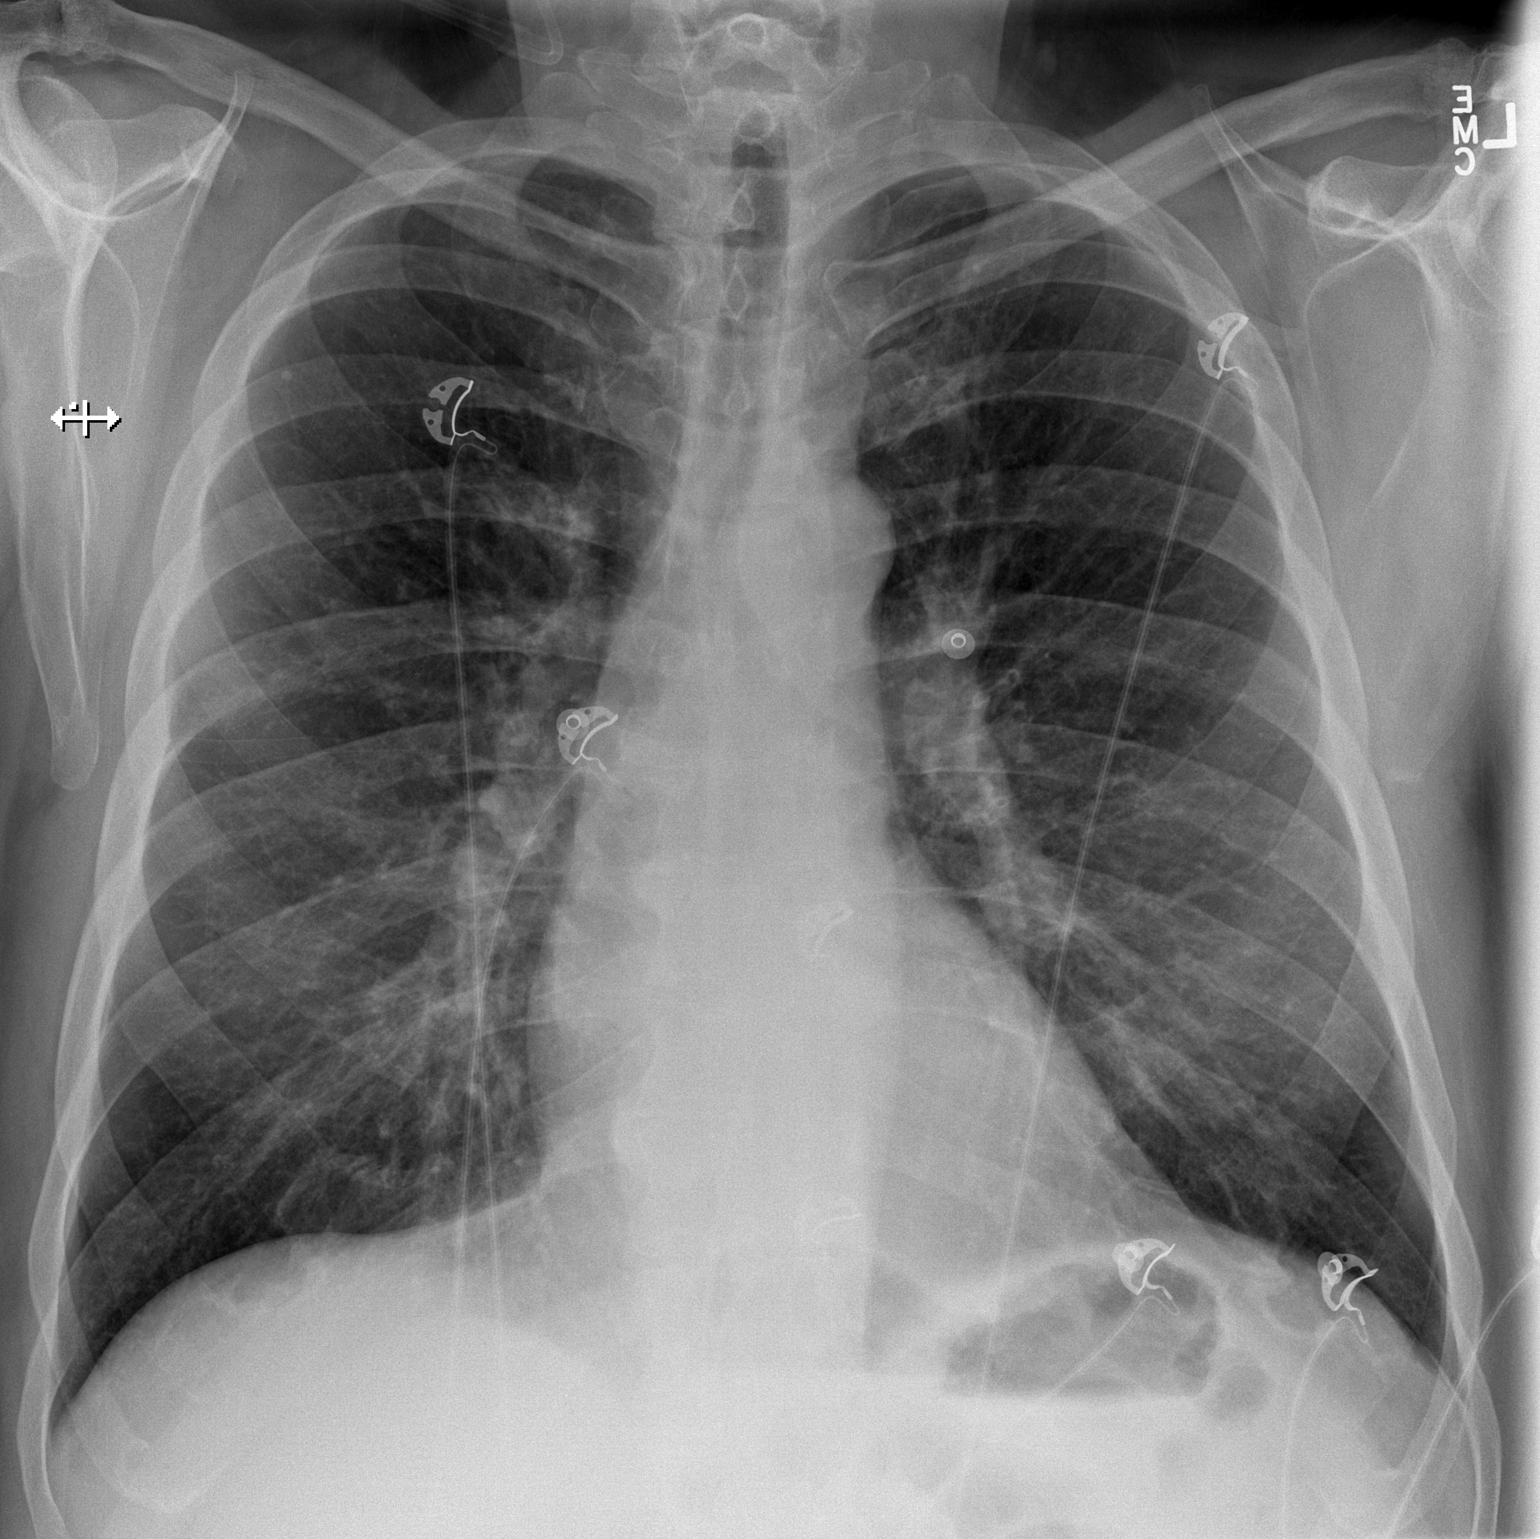

[w chest lat]
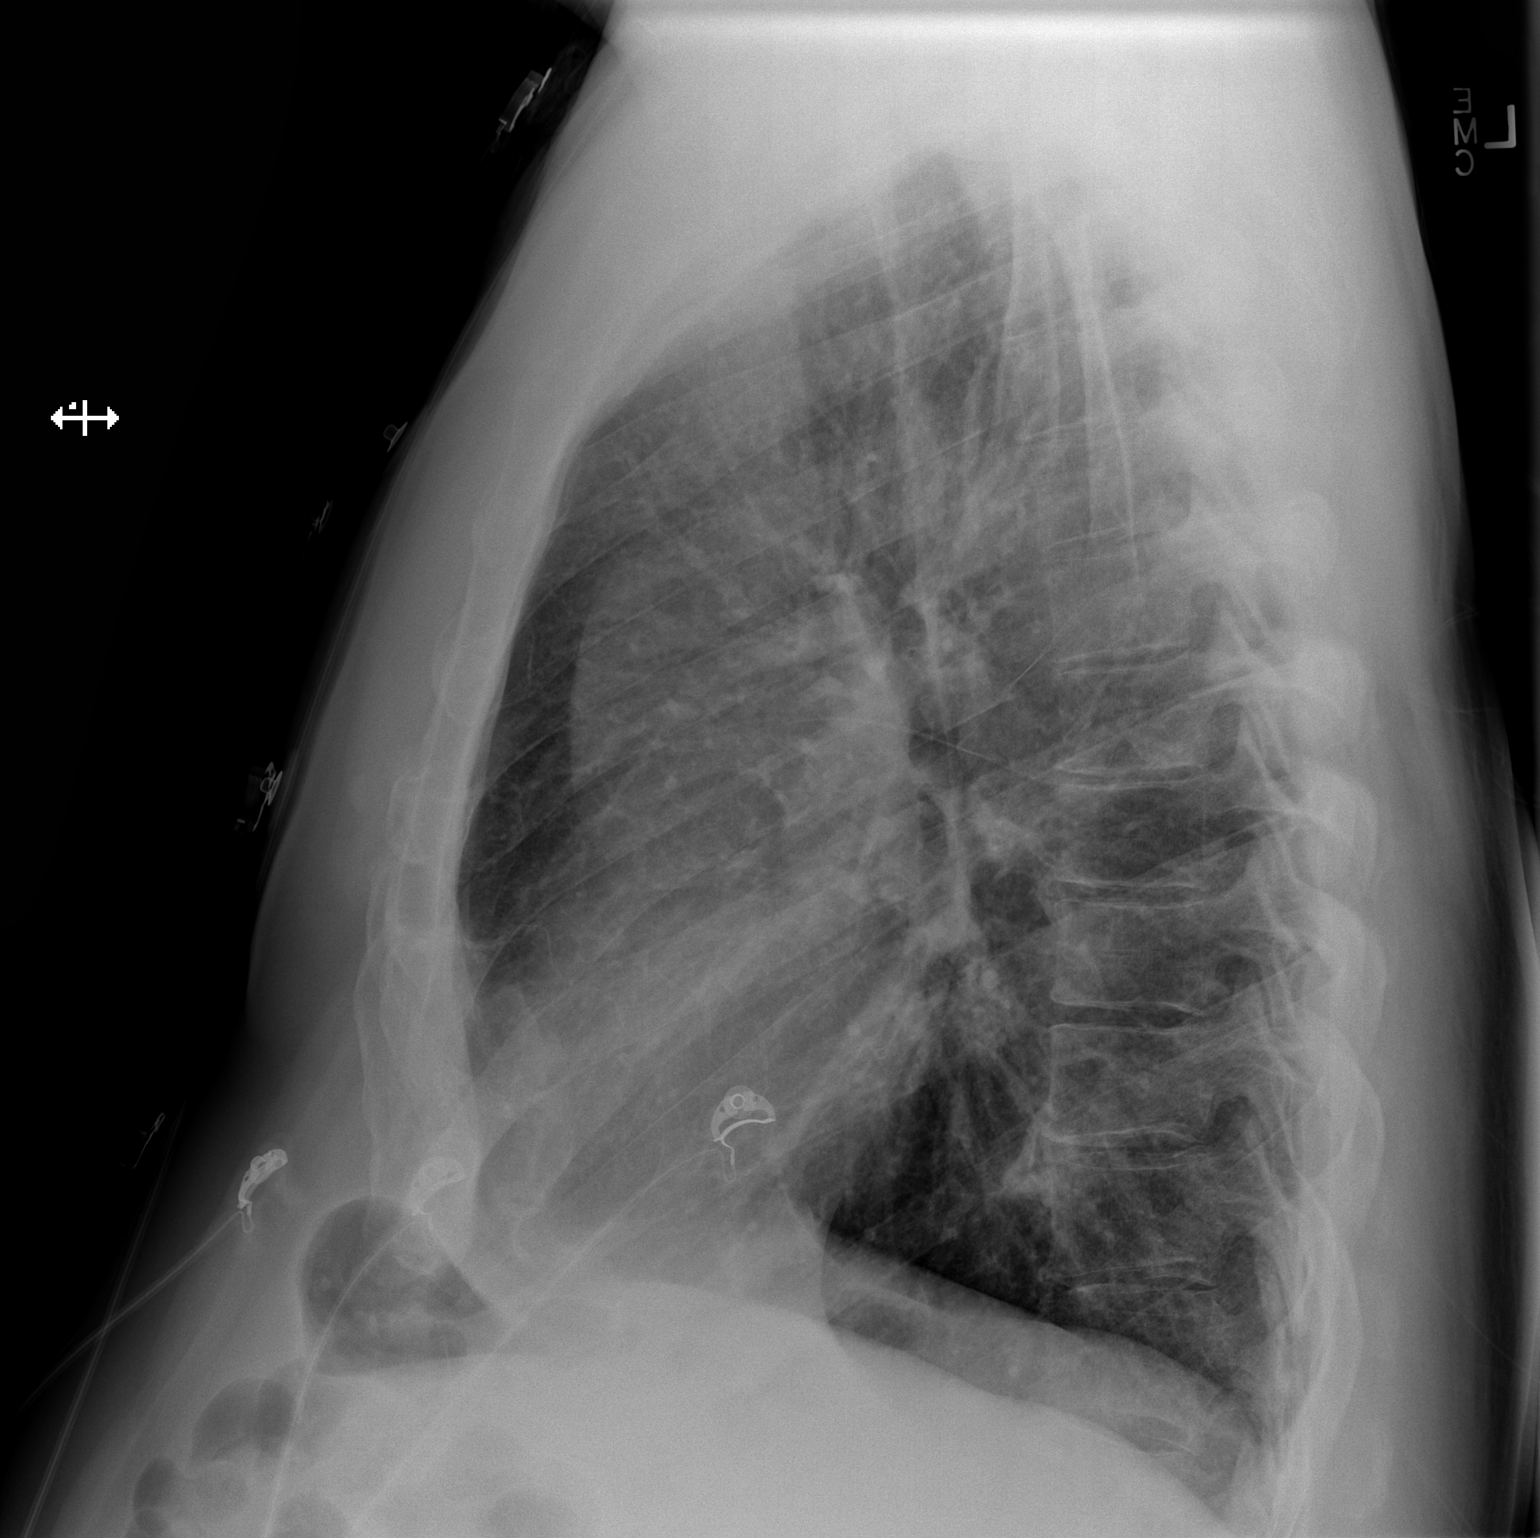

[2 of 2 positions shown; findings below may reference images not displayed]

FINDINGS: Cardiomediastinal silhouette is unremarkable. Increased lung volumes
with mildly flattened hemidiaphragm, no pleural effusion or focal
consolidation. Very mild central peribronchial wall thickening. No
pneumothorax. Soft tissue planes and included osseous structures are
normal.
IMPRESSION: Findings of COPD without superimposed acute cardiopulmonary process.

## 2017-09-03 IMAGING — CT CT ANGIO NECK
1 of 16 series · 1 of 33 positions shown · IV contrast (Iohexol (Omnipaque 350))
Comparison: None.

CLINICAL DATA: Initial evaluation for pre syncope with visual field
change.

EXAM:
CT ANGIOGRAPHY HEAD AND NECK
TECHNIQUE: Multidetector CT imaging of the head and neck was performed using
the standard protocol during bolus administration of intravenous
contrast. Multiplanar CT image reconstructions and MIPs were
obtained to evaluate the vascular anatomy. Carotid stenosis
measurements (when applicable) are obtained utilizing NASCET
criteria, using the distal internal carotid diameter as the
denominator.
CONTRAST:  50 cc of Isovue 370.

[Series 300: locator · axial · 0.33mm/px · 1 of 1 slices shown]
[im 1/1  soft-tissue]
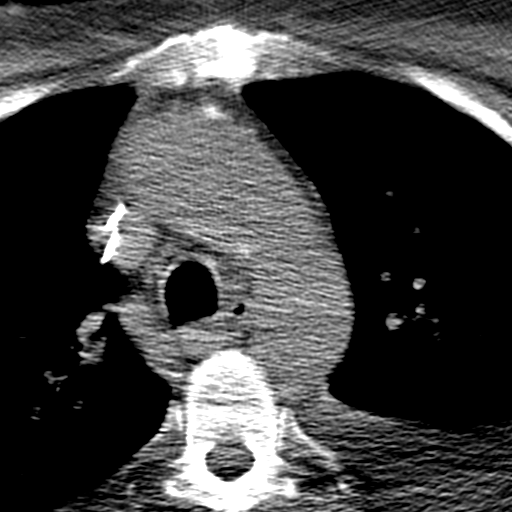

[1 of 33 positions shown; findings below may reference images not displayed]

FINDINGS: CT HEAD

Mild age-related cerebral atrophy with chronic small vessel ischemic
disease. No acute large vessel territory infarct. No acute
intracranial hemorrhage. No mass lesion, midline shift, or mass
effect. No hydrocephalus. No extra-axial fluid collection.

Scalp soft tissues within normal limits. No acute abnormality about
the orbits.

Paranasal sinuses are clear.  No mastoid effusion.

Calvarium intact.

CTA NECK

Aortic arch: Visualized aortic arch of normal caliber without acute
abnormality. Minimal plaque within the arch itself and about the
origin of the great vessels. No high-grade stenosis at the origin of
the great vessels. Visualized subclavian arteries widely patent.

Right carotid system: Right common carotid artery patent from its
origin to the bifurcation. Scattered calcified noncalcified plaque
about the right bifurcation without flow-limiting stenosis.
Associated narrowing of up to 20% by NASCET criteria. Right ICA
patent distally to the skullbase without stenosis, dissection, or
occlusion. Right external carotid artery and its branches within
normal limits.

Left carotid system: Left common carotid artery patent from its
origin to the bifurcation. Noncalcified plaque within the distal
common carotid artery with secondary mild narrowing. Scattered
calcified noncalcified plaque about the left bifurcation/proximal
left ICA without significant stenosis. Left ICA patent distally to
the skullbase without dissection, stenosis, or occlusion. Left
external carotid artery and its branches within normal limits.

Vertebral arteries:Both vertebral arteries arise from the subclavian
arteries. Focal plaque at the origin of the right vertebral artery
with mild to moderate stenosis. Left vertebral artery is dominant.
Vertebral arteries widely patent within the neck without stenosis,
dissection, or occlusion.

Skeleton: No acute osseous abnormality. No worrisome lytic or
blastic osseous lesions. Moderate degenerative spondylolysis at C5-6
and C6-7.

Other neck: Large layering left pleural effusion partially
visualized. Calcified granuloma noted within the right upper lobe.
Pacemaker noted. Visualized superior mediastinum within normal
limits. Subcentimeter hypodense nodule noted within the right lobe
of thyroid, of doubtful clinical significance. No acute soft tissue
abnormality within the neck. No adenopathy.

CTA HEAD

Anterior circulation: Mild scattered plaque within knee petrous
segments bilaterally without high-grade stenosis. Cavernous and
supraclinoid right ICA are widely patent. Mild scattered plaque
within the cavernous left ICA without stenosis. Supraclinoid left
ICA widely patent. Ophthalmic arteries appear to be patent
proximally. A1 segments patent. Right A1 segment is hypoplastic.
Dominant left A1 segment widely patent. Anterior communicating
artery normal. Anterior cerebral arteries well opacified to their
distal aspects. M1 segments patent without stenosis. MCA
bifurcations within normal limits. Distal MCA branches well
opacified and symmetric.

Posterior circulation: Left vertebral artery is dominant. Focal
plaque within the left V4 segment with secondary mild stenosis. Left
vertebral artery otherwise widely patent to the vertebrobasilar
junction. Mild atheromatous narrowing of the diminutive right V4
segment as well. Vertebrobasilar junction normal. Posterior inferior
cerebral arteries patent. Basilar artery widely patent. Superior
cerebellar arteries patent bilaterally. Both of the posterior
cerebral arteries arise from the basilar artery and are well
opacified to their distal aspects.

Venous sinuses: Patent without evidence for venous sinus thrombosis.

Anatomic variants: Hypoplastic right A1 segment with the anterior
cerebral arteries supplied via a at the left carotid artery system.
No aneurysm.

Delayed phase: No pathologic enhancement on delayed sequence.
IMPRESSION: CTA NECK IMPRESSION:

1. No high-grade flow-limiting or critical stenosis identified
within the neck.
2. Relatively mild atheromatous plaque about the carotid
bifurcations/proximal ICAs without significant flow-limiting
stenosis. Changes are slightly worse on the right were there is
narrowing of up to 20% by NASCET criteria.
3. Focal plaque at the origin of the right vertebral artery with
secondary mild to moderate stenosis. Dominant left vertebral artery
is widely patent.
4. Large layering left pleural effusion, incompletely visualized.

CTA HEAD IMPRESSION:

1. No large or proximal arterial branch occlusion within the
intracranial circulation. No high-grade or correctable stenosis. The
ophthalmic arteries are patent proximally.
2. Mild scattered plaque within the petrous segments bilaterally as
well as the cavernous left ICA without significant stenosis.
3. Calcified plaque within the left V4 segment with secondary mild
focal stenosis. Mild atheromatous narrowing of the diminutive right
V4 segment as well.
4. Mild atrophy with chronic small vessel ischemic disease.

## 2018-06-08 DEATH — deceased
